# Patient Record
Sex: Female | Born: 1935 | Race: White | Hispanic: No | Marital: Married | Smoking: Never smoker
Health system: Southern US, Community
[De-identification: ages and names within clinical notes are randomized; demographics above are authoritative.]

## PROBLEM LIST (undated history)

## (undated) DIAGNOSIS — N289 Disorder of kidney and ureter, unspecified: Secondary | ICD-10-CM

## (undated) DIAGNOSIS — G629 Polyneuropathy, unspecified: Secondary | ICD-10-CM

## (undated) DIAGNOSIS — K219 Gastro-esophageal reflux disease without esophagitis: Secondary | ICD-10-CM

## (undated) DIAGNOSIS — D649 Anemia, unspecified: Secondary | ICD-10-CM

## (undated) DIAGNOSIS — R011 Cardiac murmur, unspecified: Secondary | ICD-10-CM

## (undated) DIAGNOSIS — I499 Cardiac arrhythmia, unspecified: Secondary | ICD-10-CM

## (undated) DIAGNOSIS — R600 Localized edema: Secondary | ICD-10-CM

## (undated) DIAGNOSIS — I10 Essential (primary) hypertension: Secondary | ICD-10-CM

## (undated) DIAGNOSIS — E119 Type 2 diabetes mellitus without complications: Secondary | ICD-10-CM

## (undated) DIAGNOSIS — H9190 Unspecified hearing loss, unspecified ear: Secondary | ICD-10-CM

## (undated) DIAGNOSIS — C4490 Unspecified malignant neoplasm of skin, unspecified: Secondary | ICD-10-CM

## (undated) DIAGNOSIS — M1990 Unspecified osteoarthritis, unspecified site: Secondary | ICD-10-CM

## (undated) DIAGNOSIS — N189 Chronic kidney disease, unspecified: Secondary | ICD-10-CM

## (undated) DIAGNOSIS — E7800 Pure hypercholesterolemia, unspecified: Secondary | ICD-10-CM

## (undated) HISTORY — PX: SKIN CANCER EXCISION: SHX779

## (undated) HISTORY — PX: CARDIAC CATHETERIZATION: SHX172

## (undated) HISTORY — PX: DILATION AND CURETTAGE, DIAGNOSTIC / THERAPEUTIC: SUR384

## (undated) HISTORY — PX: EYE SURGERY: SHX253

---

## 2004-07-25 ENCOUNTER — Ambulatory Visit: Payer: Self-pay | Admitting: Internal Medicine

## 2015-04-04 ENCOUNTER — Other Ambulatory Visit: Payer: Self-pay

## 2015-04-04 ENCOUNTER — Emergency Department: Payer: Medicare Other

## 2015-04-04 ENCOUNTER — Encounter: Payer: Self-pay | Admitting: Emergency Medicine

## 2015-04-04 ENCOUNTER — Emergency Department
Admission: EM | Admit: 2015-04-04 | Discharge: 2015-04-04 | Disposition: A | Discharge status: Home / Self Care | Payer: Medicare Other | Attending: Emergency Medicine | Admitting: Emergency Medicine

## 2015-04-04 DIAGNOSIS — I10 Essential (primary) hypertension: Secondary | ICD-10-CM | POA: Diagnosis not present

## 2015-04-04 DIAGNOSIS — E119 Type 2 diabetes mellitus without complications: Secondary | ICD-10-CM | POA: Insufficient documentation

## 2015-04-04 DIAGNOSIS — R079 Chest pain, unspecified: Secondary | ICD-10-CM | POA: Diagnosis not present

## 2015-04-04 DIAGNOSIS — R0602 Shortness of breath: Secondary | ICD-10-CM | POA: Insufficient documentation

## 2015-04-04 DIAGNOSIS — I499 Cardiac arrhythmia, unspecified: Secondary | ICD-10-CM | POA: Insufficient documentation

## 2015-04-04 HISTORY — DX: Type 2 diabetes mellitus without complications: E11.9

## 2015-04-04 HISTORY — DX: Essential (primary) hypertension: I10

## 2015-04-04 HISTORY — DX: Pure hypercholesterolemia, unspecified: E78.00

## 2015-04-04 HISTORY — DX: Gastro-esophageal reflux disease without esophagitis: K21.9

## 2015-04-04 LAB — COMPREHENSIVE METABOLIC PANEL
ALBUMIN: 4.2 g/dL (ref 3.5–5.0)
ALK PHOS: 50 U/L (ref 38–126)
ALT: 12 U/L — AB (ref 14–54)
AST: 28 U/L (ref 15–41)
Anion gap: 12 (ref 5–15)
BUN: 30 mg/dL — ABNORMAL HIGH (ref 6–20)
CALCIUM: 10.5 mg/dL — AB (ref 8.9–10.3)
CHLORIDE: 100 mmol/L — AB (ref 101–111)
CO2: 25 mmol/L (ref 22–32)
CREATININE: 1.4 mg/dL — AB (ref 0.44–1.00)
GFR calc Af Amer: 41 mL/min — ABNORMAL LOW (ref >60)
GFR calc non Af Amer: 35 mL/min — ABNORMAL LOW (ref >60)
GLUCOSE: 154 mg/dL — AB (ref 65–99)
Potassium: 3.5 mmol/L (ref 3.5–5.1)
SODIUM: 137 mmol/L (ref 135–145)
TOTAL PROTEIN: 7.7 g/dL (ref 6.5–8.1)
Total Bilirubin: 0.5 mg/dL (ref 0.3–1.2)

## 2015-04-04 LAB — BRAIN NATRIURETIC PEPTIDE: B Natriuretic Peptide: 22 pg/mL (ref 0.0–100.0)

## 2015-04-04 LAB — CBC
HEMATOCRIT: 35.5 % (ref 35.0–47.0)
HEMOGLOBIN: 11.8 g/dL — AB (ref 12.0–16.0)
MCH: 28.2 pg (ref 26.0–34.0)
MCHC: 33.1 g/dL (ref 32.0–36.0)
MCV: 85.1 fL (ref 80.0–100.0)
Platelets: 226 10*3/uL (ref 150–440)
RBC: 4.17 MIL/uL (ref 3.80–5.20)
RDW: 13.3 % (ref 11.5–14.5)
WBC: 6.8 10*3/uL (ref 3.6–11.0)

## 2015-04-04 LAB — TROPONIN I: Troponin I: <0.03 ng/mL

## 2015-04-04 MED ORDER — SODIUM CHLORIDE 0.9 % IV SOLN
Freq: Once | INTRAVENOUS | Status: AC
  Administered 2015-04-04: 13:00:00 via INTRAVENOUS

## 2015-04-04 NOTE — Discharge Instructions (Signed)
Chest Pain (Nonspecific) °It is often hard to give a specific diagnosis for the cause of chest pain. There is always a chance that your pain could be related to something serious, such as a heart attack or a blood clot in the lungs. You need to follow up with your health care provider for further evaluation. °CAUSES  °· Heartburn. °· Pneumonia or bronchitis. °· Anxiety or stress. °· Inflammation around your heart (pericarditis) or lung (pleuritis or pleurisy). °· A blood clot in the lung. °· A collapsed lung (pneumothorax). It can develop suddenly on its own (spontaneous pneumothorax) or from trauma to the chest. °· Shingles infection (herpes zoster virus). °The chest wall is composed of bones, muscles, and cartilage. Any of these can be the source of the pain. °· The bones can be bruised by injury. °· The muscles or cartilage can be strained by coughing or overwork. °· The cartilage can be affected by inflammation and become sore (costochondritis). °DIAGNOSIS  °Lab tests or other studies may be needed to find the cause of your pain. Your health care provider may have you take a test called an ambulatory electrocardiogram (ECG). An ECG records your heartbeat patterns over a 24-hour period. You may also have other tests, such as: °· Transthoracic echocardiogram (TTE). During echocardiography, sound waves are used to evaluate how blood flows through your heart. °· Transesophageal echocardiogram (TEE). °· Cardiac monitoring. This allows your health care provider to monitor your heart rate and rhythm in real time. °· Holter monitor. This is a portable device that records your heartbeat and can help diagnose heart arrhythmias. It allows your health care provider to track your heart activity for several days, if needed. °· Stress tests by exercise or by giving medicine that makes the heart beat faster. °TREATMENT  °· Treatment depends on what may be causing your chest pain. Treatment may include: °· Acid blockers for  heartburn. °· Anti-inflammatory medicine. °· Pain medicine for inflammatory conditions. °· Antibiotics if an infection is present. °· You may be advised to change lifestyle habits. This includes stopping smoking and avoiding alcohol, caffeine, and chocolate. °· You may be advised to keep your head raised (elevated) when sleeping. This reduces the chance of acid going backward from your stomach into your esophagus. °Most of the time, nonspecific chest pain will improve within 2-3 days with rest and mild pain medicine.  °HOME CARE INSTRUCTIONS  °· If antibiotics were prescribed, take them as directed. Finish them even if you start to feel better. °· For the next few days, avoid physical activities that bring on chest pain. Continue physical activities as directed. °· Do not use any tobacco products, including cigarettes, chewing tobacco, or electronic cigarettes. °· Avoid drinking alcohol. °· Only take medicine as directed by your health care provider. °· Follow your health care provider's suggestions for further testing if your chest pain does not go away. °· Keep any follow-up appointments you made. If you do not go to an appointment, you could develop lasting (chronic) problems with pain. If there is any problem keeping an appointment, call to reschedule. °SEEK MEDICAL CARE IF:  °· Your chest pain does not go away, even after treatment. °· You have a rash with blisters on your chest. °· You have a fever. °SEEK IMMEDIATE MEDICAL CARE IF:  °· You have increased chest pain or pain that spreads to your arm, neck, jaw, back, or abdomen. °· You have shortness of breath. °· You have an increasing cough, or you cough   up blood.  You have severe back or abdominal pain.  You feel nauseous or vomit.  You have severe weakness.  You faint.  You have chills. This is an emergency. Do not wait to see if the pain will go away. Get medical help at once. Call your local emergency services (911 in [place] Do not drive  yourself to the hospital. MAKE SURE YOU:   Understand these instructions.  Will watch your condition.  Will get help right away if you are not doing well or get worse. Document Released: 05/03/2005 Document Revised: 07/29/2013 Document Reviewed: 02/27/2008 [facility] Patient Information 2015 [place], [place]. This information is not intended to replace advice given to you by your health care provider. Make sure you discuss any questions you have with your health care provider.  Hypercalcemia Hypercalcemia means the calcium in your blood is too high. Calcium in our blood is important for the control of many things, such as:  Blood clotting.  Conducting of nerve impulses.  Muscle contraction.  Maintaining teeth and bone health.  Other body functions. In the bloodstream, calcium maintains a constant balance with another mineral, phosphate. Calcium is absorbed into the body through the small intestine. This is helped by vitamin D. Calcium levels are maintained mostly by vitamin D and a hormone (parathyroid hormone). But the kidneys also help. Hypercalcemia can happen when the concentration of calcium is too high for the kidneys to maintain balance. The body maintains a balance between the calcium we eat and the calcium already in our body. If calcium intake is increased or we cannot use calcium properly, there may be problems. Some common sources of calcium are:   Dairy products.  Nuts.  Eggs.  Whole grains.  Legumes.  Green leafy vegetables. CAUSES There are many causes of this condition, but some common ones are:  Hyperparathyroidism. This is an overactivity of the parathyroid gland.  Cancers of the breast, kidney, lung, head, and neck are common causes of calcium increases.  Medications that cause you to urinate more often (diuretics), nausea, vomiting, and diarrhea also increase the calcium in the blood.  Overuse of calcium-containing antacids. SYMPTOMS  Many patients with  mild hypercalcemia have no symptoms. For those with symptoms, common problems include:  Loss of appetite.  Constipation.  Increased thirst.  Heart rhythm changes.  Abnormal thinking.  Nausea.  Abdominal pain.  Kidney stones.  Mood swings.  Coma and death when severe.  Vomiting.  Increased urination.  High blood pressure.  Confusion. DIAGNOSIS   Your caregiver will do a medical history and perform a physical exam on you.  Calcium and parathyroid hormone (PTH) may be measured with a blood test. TREATMENT   The treatment depends on the calcium level and what is causing the higher level. Hypercalcemia can be life threatening. Fast lowering of the calcium level may be necessary.  With normal kidney function, fluids can be given by vein to clear the excess calcium. Hemodialysis works well to reduce dangerous calcium levels if there is poor kidney function. This is a procedure in which a machine is used to filter out unwanted substances. The blood is then returned to the body.  Drugs, such as diuretics, can be given after adequate fluid intake is established. These medications help the kidneys get rid of extra calcium. Drugs that lessen (inhibit) bone loss are helpful in gaining long-term control. Phosphate pills help lower high calcium levels caused by a low supply of phosphate. Anti-inflammatory agents such as steroids are helpful with  some cancers and toxic levels of vitamin D.  Treatment of the underlying cause of the hypercalcemia will also correct the imbalance. Hyperparathyroidism is usually treated by surgical removal of one or more of the parathyroid glands and any tissue, other than the glands themselves, that is producing too much hormone.  The hypercalcemia caused by cancer is difficult to treat without controlling the cancer. Symptoms can be improved with fluids and drug therapy as outlined above. PROGNOSIS   Surgery to remove the parathyroid glands is usually  successful. This also depends on the amount of damage to the kidneys and whether or not it can be treated.  Mild hypercalcemia can be controlled with good fluid intake and the use of effective medications.  Hypercalcemia often develops as a late complication of cancer. The expected outlook is poor without effective anticancer therapy. PREVENTION   If you are at risk for developing hypercalcemia, be familiar with early symptoms. Report these to your caregiver.  Good fluid intake (up to four quarts of liquid a day if possible) is helpful.  Try to control nausea and vomiting, and treat fevers to avoid dehydration.  Lowering the amount of calcium in your diet is not necessary. High blood calcium reduces absorption of calcium in the intestine.  Stay as active as possible. SEEK IMMEDIATE MEDICAL CARE IF:   You develop chest pain, sweating, or shortness of breath.  You get confused, feel faint or pass out.  You develop severe nausea and vomiting. MAKE SURE YOU:   Understand these instructions.  Will watch your condition.  Will get help right away if you are not doing well or get worse. Document Released: 10/07/2004 Document Revised: 12/08/2013 Document Reviewed: 07/19/2010 [facility] Patient Information 2015 [place], [place]. This information is not intended to replace advice given to you by your health care provider. Make sure you discuss any questions you have with your health care provider.

## 2015-04-04 NOTE — ED Notes (Signed)
States she has had some intermittent chest pain for about 2 weeks  But this am she developed pain to mid chest and under left arm

## 2015-04-04 NOTE — ED Notes (Signed)

## 2015-04-04 NOTE — ED Provider Notes (Signed)
[facility] Emergency Department Provider Note     Time seen: ----------------------------------------- 12:14 PM on 04/04/2015 -----------------------------------------    I have reviewed the triage vital signs and the nursing notes.   HISTORY  Chief Complaint Chest Pain    HPI Paula Lara is a 81 y.o. female who presents ER for intermittent chest pain for 2 weeks. Patient mainly complains of some pain in her axilla today. Did have pain in her mid chest and under her left arm earlier, pain now is only located on her left arm. She has had this in the past she states in the 42s but was not associated with a heart attack. She denies any fevers or chills, he is short of breath. She denies any nausea vomiting or diarrhea. Patient reports outpatient testing which revealed hypercalcemia recently which she felt like was from medication that she was taking   Past Medical History  Diagnosis Date  . Hypertension   . Diabetes mellitus without complication   . GERD (gastroesophageal reflux disease)   . Hypercholesterolemia     There are no active problems to display for this patient.   History reviewed. No pertinent past surgical history.  Allergies Metformin and related and Metoprolol  Social History Social History  Substance Use Topics  . Smoking status: Never Smoker   . Smokeless tobacco: None  . Alcohol Use: No    Review of Systems Constitutional: Negative for fever. Eyes: Negative for visual changes. ENT: Negative for sore throat. Cardiovascular: Positive for chest pain Respiratory: Positive for shortness of breath Gastrointestinal: Negative for abdominal pain, vomiting and diarrhea. Genitourinary: Negative for dysuria. Musculoskeletal: Negative for back pain. Skin: Negative for rash. Neurological: Negative for headaches, focal weakness or numbness.  10-point ROS otherwise  negative.  ____________________________________________   PHYSICAL EXAM:  VITAL SIGNS: ED Triage Vitals  Enc Vitals Group     BP 04/04/15 1205 190/107 mmHg     Pulse Rate 04/04/15 1205 109     Resp 04/04/15 1205 20     Temp 04/04/15 1205 98.2 F (36.8 C)     Temp Source 04/04/15 1205 Oral     SpO2 --      Weight 04/04/15 1205 157 lb (71.215 kg)     Height 04/04/15 1205 5\' 2"  (1.575 m)     Head Cir --      Peak Flow --      Pain Score 04/04/15 1205 2     Pain Loc --      Pain Edu? --      Excl. in [place]? --     Constitutional: Alert and oriented. Well appearing and in no distress. Eyes: Conjunctivae are normal. PERRL. Normal extraocular movements. ENT   Head: Normocephalic and atraumatic.   Nose: No congestion/rhinnorhea.   Mouth/Throat: Mucous membranes are moist.   Neck: No stridor. Cardiovascular: Irregularly irregular rhythm. Normal and symmetric distal pulses are present in all extremities. No murmurs, rubs, or gallops. Respiratory: Normal respiratory effort without tachypnea nor retractions. Breath sounds are clear and equal bilaterally. No wheezes/rales/rhonchi. Gastrointestinal: Soft and nontender. No distention. No abdominal bruits.  Musculoskeletal: Nontender with normal range of motion in all extremities. No joint effusions.  No lower extremity tenderness nor edema. Neurologic:  Normal speech and language. No gross focal neurologic deficits are appreciated. Speech is normal. No gait instability. Skin:  Skin is warm, dry and intact. No rash noted. Psychiatric: Mood and affect are normal. Speech and behavior are normal. Patient exhibits appropriate insight  and judgment. ____________________________________________  EKG: Interpreted by me. Sinus tachycardia with a rate of 109 bpm, frequent PVCs, possible anterior infarct age indeterminate, normal axis.  ____________________________________________  ED COURSE:  Pertinent labs & imaging results that were  available during my care of the patient were reviewed by me and considered in my medical decision making (see chart for details). Unclear etiology for symptoms. Patient will need cardiac workup and recheck her calcium levels. Concern for hypercalcemia due to occult malignancy. ____________________________________________    LABS (pertinent positives/negatives)  Labs Reviewed  CBC - Abnormal; Notable for the following:    Hemoglobin 11.8 (*)    All other components within normal limits  COMPREHENSIVE METABOLIC PANEL - Abnormal; Notable for the following:    Chloride 100 (*)    Glucose, Bld 154 (*)    BUN 30 (*)    Creatinine, Ser 1.40 (*)    Calcium 10.5 (*)    ALT 12 (*)    GFR calc non Af Amer 35 (*)    GFR calc Af Amer 41 (*)    All other components within normal limits  TROPONIN I  BRAIN NATRIURETIC PEPTIDE  TROPONIN I    RADIOLOGY Images were viewed by me  Chest x-ray IMPRESSION: Borderline cardiomegaly, without acute disease. ____________________________________________  FINAL ASSESSMENT AND PLAN  Chest pain and hypercalcemia  Plan: Patient with labs and imaging as dictated above. Troponin was negative 2 here. She is stable for outpatient follow-up with her doctor in the next 2 days for reevaluation.   Earleen Newport, MD\  Earleen Newport, MD 04/04/15 [phone]

## 2015-04-04 NOTE — ED Notes (Signed)
Troponin to be redrawn at 245.

## 2015-12-04 ENCOUNTER — Emergency Department
Admission: EM | Admit: 2015-12-04 | Discharge: 2015-12-04 | Disposition: A | Discharge status: Home / Self Care | Payer: Medicare Other | Attending: Emergency Medicine | Admitting: Emergency Medicine

## 2015-12-04 ENCOUNTER — Encounter: Payer: Self-pay | Admitting: Emergency Medicine

## 2015-12-04 DIAGNOSIS — Y929 Unspecified place or not applicable: Secondary | ICD-10-CM | POA: Insufficient documentation

## 2015-12-04 DIAGNOSIS — S00501A Unspecified superficial injury of lip, initial encounter: Secondary | ICD-10-CM | POA: Diagnosis present

## 2015-12-04 DIAGNOSIS — I10 Essential (primary) hypertension: Secondary | ICD-10-CM | POA: Insufficient documentation

## 2015-12-04 DIAGNOSIS — E119 Type 2 diabetes mellitus without complications: Secondary | ICD-10-CM | POA: Insufficient documentation

## 2015-12-04 DIAGNOSIS — K130 Diseases of lips: Secondary | ICD-10-CM

## 2015-12-04 DIAGNOSIS — Y9384 Activity, sleeping: Secondary | ICD-10-CM | POA: Diagnosis not present

## 2015-12-04 DIAGNOSIS — S01511A Laceration without foreign body of lip, initial encounter: Secondary | ICD-10-CM | POA: Diagnosis not present

## 2015-12-04 DIAGNOSIS — W503XXA Accidental bite by another person, initial encounter: Secondary | ICD-10-CM | POA: Insufficient documentation

## 2015-12-04 DIAGNOSIS — Z7982 Long term (current) use of aspirin: Secondary | ICD-10-CM | POA: Insufficient documentation

## 2015-12-04 DIAGNOSIS — Y999 Unspecified external cause status: Secondary | ICD-10-CM | POA: Diagnosis not present

## 2015-12-04 MED ORDER — MAGIC MOUTHWASH: 5.0000 mL | Freq: Four times a day (QID) | ORAL | Status: DC | PRN

## 2015-12-04 MED ORDER — CEPHALEXIN 500 MG PO CAPS: 500.0000 mg | ORAL_CAPSULE | Freq: Two times a day (BID) | ORAL | Status: DC

## 2015-12-04 NOTE — ED Provider Notes (Signed)
[facility] Emergency Department Provider Note  ____________________________________________  Time seen: Approximately 4:41 PM  I have reviewed the triage vital signs and the nursing notes.   HISTORY  Chief Complaint Mouth Injury    HPI Paula Lara is a 82 y.o. female , NAD, presents to the emergency department with lower lip swelling 2 days. States she bit her lip and her sleep Thursday evening. She is noted increasing redness, swelling and tenderness of the inside the lip and right cheek. Has not had any fevers, chills, body aches. Has felt a little lightheaded in which she saw her primary care provider for evaluation on Wednesday who is completing lab work. Patient notes that lightheadedness is improving at this time. Patient notes that she had nausea, vomiting, diarrhea and body aches earlier in the week in which has resolved. Patient denies chest pain, shortness of breath, back pain, neck pain. Has not had any visual changes, weakness, tingling.   Past Medical History  Diagnosis Date  . Hypertension   . Diabetes mellitus without complication (Gas)   . GERD (gastroesophageal reflux disease)   . Hypercholesterolemia     There are no active problems to display for this patient.   History reviewed. No pertinent past surgical history.  Current Outpatient Rx  Name  Route  Sig  Dispense  Refill  . acetaminophen (TYLENOL) 650 MG CR tablet   Oral   Take 1,300 mg by mouth 2 (two) times daily.         Marland Kitchen amoxicillin (AMOXIL) 500 MG capsule   Oral   Take 1,000 mg by mouth 2 (two) times daily. X 10 days.      0   . aspirin 325 MG EC tablet   Oral   Take 325 mg by mouth daily.         . carvedilol (COREG) 6.25 MG tablet   Oral   Take 6.25 mg by mouth 2 (two) times daily.      0   . cephALEXin (KEFLEX) 500 MG capsule   Oral   Take 1 capsule (500 mg total) by mouth 2 (two) times daily.   14 capsule   0   . cholecalciferol (VITAMIN D)  1000 UNITS tablet   Oral   Take 1,000 Units by mouth 2 (two) times daily.         . diphenhydrAMINE (BENADRYL) 25 MG tablet   Oral   Take 25 mg by mouth at bedtime as needed for itching, allergies or sleep.         Mariane Baumgarten Sodium 100 MG capsule   Oral   Take 100 mg by mouth 2 (two) times daily.         . enalapril (VASOTEC) 10 MG tablet   Oral   Take 10-20 mg by mouth 2 (two) times daily. Take 1 tablet orally every morning and Take 2 tablets (20mg ) orally every morning.      1   . enalapril-hydrochlorothiazide (VASERETIC) 10-25 MG per tablet   Oral   Take 1 tablet by mouth every morning.      3   . glipiZIDE (GLUCOTROL) 10 MG tablet   Oral   Take 10 mg by mouth 2 (two) times daily before a meal. T      3   . Inositol Niacinate (NIACIN FLUSH FREE) 500 MG CAPS   Oral   Take 1,000 mg by mouth at bedtime.         Marland Kitchen loratadine (CLARITIN)  10 MG tablet   Oral   Take 10 mg by mouth daily.         . magic mouthwash SOLN   Oral   Take 5 mLs by mouth 4 (four) times daily as needed for mouth pain. Please mix 53mL of each ingredient totaling 140mL   180 mL   0   . omeprazole (PRILOSEC) 40 MG capsule   Oral   Take 40 mg by mouth daily.      0   . Polyethylene Glycol 3350 (MIRALAX PO)   Oral   Take 17 g by mouth daily.         Marland Kitchen triamcinolone (NASACORT ALLERGY 24HR CHILDREN) 55 MCG/ACT AERO nasal inhaler   Nasal   Place 2 sprays into the nose daily as needed (for allergies.).          Marland Kitchen triamcinolone cream (KENALOG) 0.1 %   Topical   Apply 1 application topically 2 (two) times daily as needed (for rash.).           Allergies Metformin and related and Metoprolol  History reviewed. No pertinent family history.  Social History Social History  Substance Use Topics  . Smoking status: Never Smoker   . Smokeless tobacco: None  . Alcohol Use: No     Review of Systems  Constitutional: No fever/chills or fatigue Eyes: No visual changes.  ENT:  Positive pain about lower lip with swelling. No sore throat, mouth pain or mouth sores. Cardiovascular: No chest pain. Respiratory: No shortness of breath. No wheezing.  Gastrointestinal: No abdominal pain.  No nausea, vomiting.  Musculoskeletal: Negative for general myalgias.  Skin: Positive laceration lower lip causing swelling and redness. Negative for rash. Neurological: Negative for headaches, focal weakness or numbness. No tingling 10-point ROS otherwise negative.  ____________________________________________   PHYSICAL EXAM:  VITAL SIGNS: ED Triage Vitals  Enc Vitals Group     BP 12/04/15 1551 119/92 mmHg     Pulse Rate 12/04/15 1551 61     Resp 12/04/15 1551 18     Temp 12/04/15 1551 98.1 F (36.7 C)     Temp Source 12/04/15 1551 Oral     SpO2 12/04/15 1551 98 %     Weight 12/04/15 1551 154 lb (69.854 kg)     Height 12/04/15 1551 5\' 4"  (1.626 m)     Head Cir --      Peak Flow --      Pain Score 12/04/15 1555 0     Pain Loc --      Pain Edu? --      Excl. in [place]? --      Constitutional: Alert and oriented. Well appearing and in no acute distress. Eyes: Conjunctivae are normal. PERRL. EOMI without pain.  Head: Atraumatic. ENT:      Nose: No congestion/rhinnorhea.      Mouth/Throat: Superficial laceration noted to the anterior lower lip on the right side. Area with mild erythema surrounding and ulcerous type lesion. Mild tenderness to palpation. Mucous membranes are moist.  Neck: Supple with full range of motion. Hematological/Lymphatic/Immunilogical: No cervical lymphadenopathy. Cardiovascular:  Good peripheral circulation with 2+ pulses noted in the right upper extremity. Respiratory: Normal respiratory effort without tachypnea or retractions.  Neurologic:  Normal speech and language. No gross focal neurologic deficits are appreciated.  Skin:  Skin is warm, dry and intact. No rash noted. Psychiatric: Mood and affect are normal. Speech and behavior are normal.  Patient exhibits appropriate insight and judgement.  ____________________________________________   LABS  None ____________________________________________  EKG  None ____________________________________________  RADIOLOGY  None ____________________________________________    PROCEDURES  Procedure(s) performed: None    Medications - No data to display   ____________________________________________   INITIAL IMPRESSION / ASSESSMENT AND PLAN / ED COURSE  Patient's diagnosis is consistent with laceration of lower lip due to biting her lower lip causing cellulitis and ulcers. Patient will be discharged home with prescriptions for Magic mouthwash and Keflex to take as directed. Patient may take Tylenol as needed for pain. Patient is to follow up with her primary care provider or [facility] if symptoms persist past this treatment course. Patient is given ED precautions to return to the ED for any worsening or new symptoms.    ____________________________________________  FINAL CLINICAL IMPRESSION(S) / ED DIAGNOSES  Final diagnoses:  Laceration of lower lip, initial encounter  Cellulitis of vermilion border of lower lip      NEW MEDICATIONS STARTED DURING THIS VISIT:  Discharge Medication List as of 12/04/2015  5:00 PM    START taking these medications   Details  cephALEXin (KEFLEX) 500 MG capsule Take 1 capsule (500 mg total) by mouth 2 (two) times daily., Starting 12/04/2015, Until Discontinued, Print    magic mouthwash SOLN Take 5 mLs by mouth 4 (four) times daily as needed for mouth pain. Please mix 76mL of each ingredient totaling 171mL, Starting 12/04/2015, Until Discontinued, Print             Braxton Feathers, PA-C 12/04/15 1803  Lisa Roca, MD 12/05/15 [phone]

## 2015-12-04 NOTE — Discharge Instructions (Signed)
Mouth Laceration A mouth laceration is a deep cut inside your mouth. The cut may go into your lip or go all of the way through your mouth and cheek. The cut may involve your tongue, the insides of your check, or the upper surface of your mouth (palate). Mouth lacerations may bleed a lot and may need to be treated with stitches (sutures). HOME CARE  Take medicines only as told by your doctor.  If you were prescribed an antibiotic medicine, finish all of it even if you start to feel better.  Eat as told by your doctor. You may only be able to eat drink liquids or eat soft foods for a few days.  Rinse your mouth with a warm, salt-water rinse 4-6 times per day or as told by your doctor. You can make a salt-water rinse by mixing one tsp of salt into two cups of warm water.  Do not poke the sutures with your tongue. Doing that can loosen them.  Check your wound every day for signs of infection. It is normal to have a white or gray patch over your wound while it heals. Watch for:  Redness.  Puffiness (swelling).  Blood or pus.  Keep your mouth and teeth clean (oral hygiene) like you normally do, if possible. Gently brush your teeth with a soft, nylon-bristled toothbrush 2 times per day.  Keep all follow-up visits as told by your doctor. This is important. GET HELP IF:  You got a tetanus shot and you have swelling, really bad pain, redness, or bleeding at the injection site.  You have a fever.  Medicine does not help your pain.  You have redness, swelling, or pain at your wound that is getting worse.  You have fresh bleeding or pus coming from your wound.  The edges of your wound break open.  Your neck or throat is puffy or tender. GET HELP RIGHT AWAY IF:  You have swelling in your face or the area under your jaw.  You have trouble breathing or swallowing.   This information is not intended to replace advice given to you by your health care provider. Make sure you discuss any  questions you have with your health care provider.   Document Released: 01/10/2008 Document Revised: 12/08/2014 Document Reviewed: 07/15/2014 Elsevier Interactive Patient Education 2016 [place] If you have a wound, it may take some time to heal. Eventually, a scar will form. The scar will also fade with time. It is important to take care of your wound while it is healing. This helps to protect your wound from infection.  HOW SHOULD I TAKE CARE OF MY WOUND AT HOME?  Some wounds are allowed to close on their own or are repaired at a later date. There are many different ways to close and cover a wound, including stitches (sutures), skin glue, and adhesive strips. Follow your health care provider's instructions about:  Wound care.  Bandage (dressing) changes and removal.  Wound closure removal.  Take medicines only as directed by your health care provider.  Keep all follow-up visits as directed by your health care provider. This is important.  Do not take baths, swim, or use a hot tub until your health care provider approves. You may shower as directed by your health care provider.  Keep your wound clean and dry. WHAT AFFECTS SCAR FORMATION? Scars affect each person differently. How your body scars depends on:  The location and size of your wound.  Traits that  you inherited from your parents (genetic predisposition).  How you take care of your wound. Irritation and inflammation increase the amount of scar formation.  Sun exposure. This can darken a scar. WHEN SHOULD I CALL OR SEE [place] PROVIDER? Call or see your health care provider if:  You have redness, swelling, or pain at your wound site.  You have fluid, blood, or pus coming from your wound.  You have muscle aches, chills, or a general ill feeling.  You notice a bad smell coming from the wound.  Your wound separates after the sutures, staples, or skin adhesive strips have been removed.  You  have persistent nausea or vomiting.  You have a fever.  You are dizzy. WHEN SHOULD I CALL 911 OR GO TO THE EMERGENCY ROOM? Call 911 or go to the emergency room if:  You faint.  You have difficulty breathing.   This information is not intended to replace advice given to you by your health care provider. Make sure you discuss any questions you have with your health care provider.   Document Released: 04/29/2004 Document Revised: 08/14/2014 Document Reviewed: 05/05/2014 Elsevier Interactive Patient Education [employer].

## 2015-12-04 NOTE — ED Notes (Signed)
Bit lip on Thursday, now red and swollen on the inside of lip and cheek

## 2015-12-05 ENCOUNTER — Telehealth: Payer: Self-pay | Admitting: Emergency Medicine

## 2016-06-23 ENCOUNTER — Other Ambulatory Visit: Payer: Self-pay | Admitting: Nephrology

## 2016-06-23 DIAGNOSIS — N183 Chronic kidney disease, stage 3 unspecified: Secondary | ICD-10-CM

## 2016-06-28 ENCOUNTER — Ambulatory Visit
Admission: RE | Admit: 2016-06-28 | Discharge: 2016-06-28 | Disposition: A | Discharge status: Home / Self Care | Payer: Medicare Other | Source: Ambulatory Visit | Attending: Nephrology | Admitting: Nephrology

## 2016-06-28 DIAGNOSIS — N183 Chronic kidney disease, stage 3 unspecified: Secondary | ICD-10-CM

## 2016-06-28 DIAGNOSIS — N281 Cyst of kidney, acquired: Secondary | ICD-10-CM | POA: Diagnosis not present

## 2016-09-10 ENCOUNTER — Encounter: Payer: Self-pay | Admitting: Emergency Medicine

## 2016-09-10 ENCOUNTER — Emergency Department: Payer: Medicare Other

## 2016-09-10 ENCOUNTER — Observation Stay
Admission: EM | Admit: 2016-09-10 | Discharge: 2016-09-11 | Disposition: A | Discharge status: Home / Self Care | Payer: Medicare Other | Attending: Internal Medicine | Admitting: Internal Medicine

## 2016-09-10 DIAGNOSIS — N183 Chronic kidney disease, stage 3 unspecified: Secondary | ICD-10-CM

## 2016-09-10 DIAGNOSIS — Z7982 Long term (current) use of aspirin: Secondary | ICD-10-CM | POA: Diagnosis not present

## 2016-09-10 DIAGNOSIS — I10 Essential (primary) hypertension: Secondary | ICD-10-CM

## 2016-09-10 DIAGNOSIS — Z7984 Long term (current) use of oral hypoglycemic drugs: Secondary | ICD-10-CM | POA: Insufficient documentation

## 2016-09-10 DIAGNOSIS — R55 Syncope and collapse: Secondary | ICD-10-CM | POA: Diagnosis not present

## 2016-09-10 DIAGNOSIS — Z79899 Other long term (current) drug therapy: Secondary | ICD-10-CM | POA: Diagnosis not present

## 2016-09-10 DIAGNOSIS — I129 Hypertensive chronic kidney disease with stage 1 through stage 4 chronic kidney disease, or unspecified chronic kidney disease: Secondary | ICD-10-CM | POA: Diagnosis not present

## 2016-09-10 DIAGNOSIS — K219 Gastro-esophageal reflux disease without esophagitis: Secondary | ICD-10-CM | POA: Insufficient documentation

## 2016-09-10 DIAGNOSIS — R002 Palpitations: Secondary | ICD-10-CM

## 2016-09-10 DIAGNOSIS — E860 Dehydration: Secondary | ICD-10-CM | POA: Insufficient documentation

## 2016-09-10 DIAGNOSIS — E1122 Type 2 diabetes mellitus with diabetic chronic kidney disease: Secondary | ICD-10-CM | POA: Insufficient documentation

## 2016-09-10 HISTORY — DX: Disorder of kidney and ureter, unspecified: N28.9

## 2016-09-10 LAB — COMPREHENSIVE METABOLIC PANEL
ALT: 9 U/L — ABNORMAL LOW (ref 14–54)
AST: 16 U/L (ref 15–41)
Albumin: 4.1 g/dL (ref 3.5–5.0)
Alkaline Phosphatase: 64 U/L (ref 38–126)
Anion gap: 10 (ref 5–15)
BILIRUBIN TOTAL: 0.5 mg/dL (ref 0.3–1.2)
BUN: 31 mg/dL — ABNORMAL HIGH (ref 6–20)
CHLORIDE: 101 mmol/L (ref 101–111)
CO2: 25 mmol/L (ref 22–32)
CREATININE: 1.38 mg/dL — AB (ref 0.44–1.00)
Calcium: 9.2 mg/dL (ref 8.9–10.3)
GFR, EST AFRICAN AMERICAN: 41 mL/min — AB (ref >60)
GFR, EST NON AFRICAN AMERICAN: 35 mL/min — AB (ref >60)
Glucose, Bld: 99 mg/dL (ref 65–99)
POTASSIUM: 3.6 mmol/L (ref 3.5–5.1)
Sodium: 136 mmol/L (ref 135–145)
TOTAL PROTEIN: 7.8 g/dL (ref 6.5–8.1)

## 2016-09-10 LAB — TSH: TSH: 2.168 u[IU]/mL (ref 0.350–4.500)

## 2016-09-10 LAB — GLUCOSE, CAPILLARY: Glucose-Capillary: 124 mg/dL — ABNORMAL HIGH (ref 65–99)

## 2016-09-10 LAB — CBC
HEMATOCRIT: 32.6 % — AB (ref 35.0–47.0)
Hemoglobin: 11.3 g/dL — ABNORMAL LOW (ref 12.0–16.0)
MCH: 29.4 pg (ref 26.0–34.0)
MCHC: 34.7 g/dL (ref 32.0–36.0)
MCV: 84.7 fL (ref 80.0–100.0)
PLATELETS: 318 10*3/uL (ref 150–440)
RBC: 3.85 MIL/uL (ref 3.80–5.20)
RDW: 12.8 % (ref 11.5–14.5)
WBC: 6.4 10*3/uL (ref 3.6–11.0)

## 2016-09-10 LAB — MAGNESIUM: Magnesium: 1.7 mg/dL (ref 1.7–2.4)

## 2016-09-10 LAB — TROPONIN I: Troponin I: <0.03 ng/mL (ref <0.03)

## 2016-09-10 MED ORDER — CARVEDILOL 6.25 MG PO TABS
12.5000 mg | ORAL_TABLET | Freq: Two times a day (BID) | ORAL | Status: DC
  Administered 2016-09-10 – 2016-09-11 (×2): 12.5 mg via ORAL
  Filled 2016-09-10: qty 2

## 2016-09-10 MED ORDER — ACETAMINOPHEN 325 MG PO TABS: 650.0000 mg | ORAL_TABLET | Freq: Four times a day (QID) | ORAL | Status: DC | PRN

## 2016-09-10 MED ORDER — HEPARIN SODIUM (PORCINE) 5000 UNIT/ML IJ SOLN
5000.0000 [IU] | Freq: Three times a day (TID) | INTRAMUSCULAR | Status: DC
  Administered 2016-09-10 – 2016-09-11 (×2): 5000 [IU] via SUBCUTANEOUS
  Filled 2016-09-10 (×2): qty 1

## 2016-09-10 MED ORDER — POTASSIUM CHLORIDE IN NACL 20-0.9 MEQ/L-% IV SOLN
INTRAVENOUS | Status: DC
  Administered 2016-09-10 – 2016-09-11 (×2): via INTRAVENOUS
  Filled 2016-09-10 (×3): qty 1000

## 2016-09-10 MED ORDER — PANTOPRAZOLE SODIUM 40 MG PO TBEC
40.0000 mg | DELAYED_RELEASE_TABLET | Freq: Every day | ORAL | Status: DC
  Administered 2016-09-10 – 2016-09-11 (×2): 40 mg via ORAL
  Filled 2016-09-10 (×2): qty 1

## 2016-09-10 MED ORDER — INSULIN ASPART 100 UNIT/ML ~~LOC~~ SOLN
0.0000 [IU] | Freq: Three times a day (TID) | SUBCUTANEOUS | Status: DC
Start: 2016-09-10 — End: 2016-09-11

## 2016-09-10 MED ORDER — HYDROCODONE-ACETAMINOPHEN 5-325 MG PO TABS: 1.0000 | ORAL_TABLET | Freq: Four times a day (QID) | ORAL | Status: DC | PRN

## 2016-09-10 MED ORDER — DIPHENHYDRAMINE HCL 25 MG PO CAPS
50.0000 mg | ORAL_CAPSULE | Freq: Every day | ORAL | Status: DC
  Administered 2016-09-10: 50 mg via ORAL
  Filled 2016-09-10: qty 2

## 2016-09-10 MED ORDER — ENALAPRIL-HYDROCHLOROTHIAZIDE 10-25 MG PO TABS: 1.0000 | ORAL_TABLET | ORAL | Status: DC

## 2016-09-10 MED ORDER — CARVEDILOL 6.25 MG PO TABS
ORAL_TABLET | ORAL | Status: AC
  Filled 2016-09-10: qty 2

## 2016-09-10 MED ORDER — ONDANSETRON HCL 4 MG PO TABS: 4.0000 mg | ORAL_TABLET | Freq: Four times a day (QID) | ORAL | Status: DC | PRN

## 2016-09-10 MED ORDER — ENALAPRIL MALEATE 10 MG PO TABS
10.0000 mg | ORAL_TABLET | Freq: Every day | ORAL | Status: DC
  Administered 2016-09-11: 10 mg via ORAL
  Filled 2016-09-10: qty 1

## 2016-09-10 MED ORDER — MAGNESIUM SULFATE 2 GM/50ML IV SOLN
INTRAVENOUS | Status: AC
  Filled 2016-09-10: qty 50

## 2016-09-10 MED ORDER — NIACIN ER 500 MG PO CPCR
1000.0000 mg | ORAL_CAPSULE | Freq: Every day | ORAL | Status: DC
  Filled 2016-09-10: qty 4

## 2016-09-10 MED ORDER — DOCUSATE SODIUM 100 MG PO CAPS
100.0000 mg | ORAL_CAPSULE | Freq: Two times a day (BID) | ORAL | Status: DC
  Administered 2016-09-10: 100 mg via ORAL
  Filled 2016-09-10 (×2): qty 1

## 2016-09-10 MED ORDER — GLIPIZIDE 10 MG PO TABS
10.0000 mg | ORAL_TABLET | Freq: Two times a day (BID) | ORAL | Status: DC
  Administered 2016-09-11: 10 mg via ORAL
  Filled 2016-09-10: qty 1

## 2016-09-10 MED ORDER — ONDANSETRON HCL 4 MG/2ML IJ SOLN: 4.0000 mg | Freq: Four times a day (QID) | INTRAMUSCULAR | Status: DC | PRN

## 2016-09-10 MED ORDER — ASPIRIN EC 325 MG PO TBEC
325.0000 mg | DELAYED_RELEASE_TABLET | Freq: Every day | ORAL | Status: DC
  Administered 2016-09-11: 325 mg via ORAL
  Filled 2016-09-10: qty 1

## 2016-09-10 MED ORDER — TRIAMCINOLONE ACETONIDE 55 MCG/ACT NA AERO
2.0000 | INHALATION_SPRAY | Freq: Every day | NASAL | Status: DC | PRN
  Filled 2016-09-10: qty 21.6

## 2016-09-10 MED ORDER — MAGNESIUM OXIDE 400 (241.3 MG) MG PO TABS
400.0000 mg | ORAL_TABLET | Freq: Every day | ORAL | Status: DC
  Administered 2016-09-11: 400 mg via ORAL
  Filled 2016-09-10: qty 1

## 2016-09-10 MED ORDER — HYDROCHLOROTHIAZIDE 25 MG PO TABS
25.0000 mg | ORAL_TABLET | Freq: Every day | ORAL | Status: DC
  Administered 2016-09-11: 25 mg via ORAL
  Filled 2016-09-10: qty 1

## 2016-09-10 MED ORDER — SODIUM CHLORIDE 0.9% FLUSH
3.0000 mL | Freq: Two times a day (BID) | INTRAVENOUS | Status: DC
  Administered 2016-09-10: 3 mL via INTRAVENOUS

## 2016-09-10 MED ORDER — LORATADINE 10 MG PO TABS
10.0000 mg | ORAL_TABLET | Freq: Every day | ORAL | Status: DC
  Administered 2016-09-11: 10 mg via ORAL
  Filled 2016-09-10: qty 1

## 2016-09-10 MED ORDER — ACETAMINOPHEN 650 MG RE SUPP: 650.0000 mg | Freq: Four times a day (QID) | RECTAL | Status: DC | PRN

## 2016-09-10 MED ORDER — MAGNESIUM SULFATE 2 GM/50ML IV SOLN
2.0000 g | Freq: Once | INTRAVENOUS | Status: AC
  Administered 2016-09-10: 2 g via INTRAVENOUS
  Filled 2016-09-10: qty 50

## 2016-09-10 MED ORDER — VITAMIN D3 25 MCG (1000 UNIT) PO TABS
1000.0000 [IU] | ORAL_TABLET | Freq: Two times a day (BID) | ORAL | Status: DC
  Administered 2016-09-10 – 2016-09-11 (×2): 1000 [IU] via ORAL
  Filled 2016-09-10 (×4): qty 1

## 2016-09-10 MED ORDER — POTASSIUM CHLORIDE IN NACL 20-0.9 MEQ/L-% IV SOLN
INTRAVENOUS | Status: AC
  Filled 2016-09-10: qty 1000

## 2016-09-10 MED ORDER — BISACODYL 10 MG RE SUPP: 10.0000 mg | Freq: Every day | RECTAL | Status: DC | PRN

## 2016-09-10 NOTE — ED Notes (Signed)
Pt states that today she was up cooking and started feeling lightheaded, pt states that she had similar episode a few weeks ago and actually passed out, pt states that family checked her heart rate at that time and it was in the 40's. Pt states that she felt wiped out for a while, pt states that today she started feeling the same way and laid down and noticed that she was having a fluttering sensation in her chest, pt states that she has a hx of pvc's but does not have a cardiologist. No distress noted at this time, pt denies chest pain, skin warm and dry, daughter sitting at bedside talking with patient

## 2016-09-10 NOTE — ED Triage Notes (Signed)
Pt reports she was was feeling lightheaded and felt fluttering in her chest at about 1300 today states she checked her HR on her grandsons sports watch and it read 41bpm. HR 84 in triage pt reports lightheaded remains no pain.

## 2016-09-10 NOTE — Progress Notes (Signed)
Patient requested for benadryl 50 mg oral  for sleep since she takes it as needed at home. Dr. Earleen Newport notified with a new order for benadryl 50 mg  oral at bedtime. Will continue to monitor

## 2016-09-10 NOTE — ED Provider Notes (Signed)
[facility] Emergency Department Provider Note  ____________________________________________   I have reviewed the triage vital signs and the nursing notes.   HISTORY  Chief Complaint Palpitations    HPI Paula Lara is a 83 y.o. female who presents today complaining of feeling like she might pass out today. Patient has a history of some chronic kidney disease. And she states she has had frequent PVCs but never seen a doctor. 3 weeks ago, she felt lightheaded and actually passed out. She did not suffer any injury. She went back to her normal state of health today, she noted that her PVCs were coming much more frequent and she felt that she was going to pass out again. Patient has no chest pain shortness of breath nausea or vomiting, she feels slightly lightheaded at this time but better than she did earlier. She states usually when she gets these attacks, she lies down he gets better but today she still felt lightheaded afterwards. She is only passed out once from these palpitations. She has never seen a cardiologist for this or been on a Holter monitor although apparently she is talking about possibly doing this with her primary care doctor.      Past Medical History:  Diagnosis Date  . Diabetes mellitus without complication ([place])   . GERD (gastroesophageal reflux disease)   . Hypercholesterolemia   . Hypertension   . Renal disorder     There are no active problems to display for this patient.   History reviewed. No pertinent surgical history.  Prior to Admission medications   Medication Sig Start Date End Date Taking? Authorizing Provider  acetaminophen (TYLENOL) 650 MG CR tablet Take 1,300 mg by mouth 2 (two) times daily.    Historical Provider, MD  amoxicillin (AMOXIL) 500 MG capsule Take 1,000 mg by mouth 2 (two) times daily. X 10 days. 04/02/15   Historical Provider, MD  aspirin 325 MG EC tablet Take 325 mg by mouth daily.    Historical  Provider, MD  carvedilol (COREG) 6.25 MG tablet Take 6.25 mg by mouth 2 (two) times daily. 04/02/15   Historical Provider, MD  cephALEXin (KEFLEX) 500 MG capsule Take 1 capsule (500 mg total) by mouth 2 (two) times daily. 12/04/15   Jami L Hagler, PA-C  cholecalciferol (VITAMIN D) 1000 UNITS tablet Take 1,000 Units by mouth 2 (two) times daily.    Historical Provider, MD  diphenhydrAMINE (BENADRYL) 25 MG tablet Take 25 mg by mouth at bedtime as needed for itching, allergies or sleep.    Historical Provider, MD  Docusate Sodium 100 MG capsule Take 100 mg by mouth 2 (two) times daily.    Historical Provider, MD  enalapril (VASOTEC) 10 MG tablet Take 10-20 mg by mouth 2 (two) times daily. Take 1 tablet orally every morning and Take 2 tablets (20mg ) orally every morning. 02/19/15   Historical Provider, MD  enalapril-hydrochlorothiazide (VASERETIC) 10-25 MG per tablet Take 1 tablet by mouth every morning. 03/20/15   Historical Provider, MD  glipiZIDE (GLUCOTROL) 10 MG tablet Take 10 mg by mouth 2 (two) times daily before a meal. T 01/11/15   Historical Provider, MD  Inositol Niacinate (NIACIN FLUSH FREE) 500 MG CAPS Take 1,000 mg by mouth at bedtime.    Historical Provider, MD  loratadine (CLARITIN) 10 MG tablet Take 10 mg by mouth daily.    Historical Provider, MD  magic mouthwash SOLN Take 5 mLs by mouth 4 (four) times daily as needed for mouth pain. Please mix  81mL of each ingredient totaling 136mL 12/04/15   Jami L Hagler, PA-C  omeprazole (PRILOSEC) 40 MG capsule Take 40 mg by mouth daily. 04/02/15   Historical Provider, MD  Polyethylene Glycol 3350 (MIRALAX PO) Take 17 g by mouth daily.    Historical Provider, MD  triamcinolone (NASACORT ALLERGY 24HR CHILDREN) 55 MCG/ACT AERO nasal inhaler Place 2 sprays into the nose daily as needed (for allergies.).     Historical Provider, MD  triamcinolone cream (KENALOG) 0.1 % Apply 1 application topically 2 (two) times daily as needed (for rash.).    Historical  Provider, MD    Allergies Metformin and related and Metoprolol  No family history on file.  Social History Social History  Substance Use Topics  . Smoking status: Never Smoker  . Smokeless tobacco: Never Used  . Alcohol use No    Review of Systems Constitutional: No fever/chills Eyes: No visual changes. ENT: No sore throat. No stiff neck no neck pain Cardiovascular: Denies chest pain. Respiratory: Denies shortness of breath. Gastrointestinal:   no vomiting.  No diarrhea.  No constipation. Genitourinary: Negative for dysuria. Musculoskeletal: Negative lower extremity swelling Skin: Negative for rash. Neurological: Negative for severe headaches, focal weakness or numbness. 10-point ROS otherwise negative.  ____________________________________________   PHYSICAL EXAM:  VITAL SIGNS: ED Triage Vitals  Enc Vitals Group     BP 09/10/16 1456 (!) 168/93     Pulse Rate 09/10/16 1456 84     Resp 09/10/16 1456 20     Temp 09/10/16 1456 97.8 F (36.6 C)     Temp Source 09/10/16 1456 Oral     SpO2 09/10/16 1456 100 %     Weight 09/10/16 1458 146 lb (66.2 kg)     Height 09/10/16 1458 5\' 2"  (1.575 m)     Head Circumference --      Peak Flow --      Pain Score --      Pain Loc --      Pain Edu? --      Excl. in [place]? --     Constitutional: Alert and oriented. Well appearing and in no acute distress. Eyes: Conjunctivae are normal. PERRL. EOMI. Head: Atraumatic. Nose: No congestion/rhinnorhea. Mouth/Throat: Mucous membranes are moist.  Oropharynx non-erythematous. Neck: No stridor.   Nontender with no meningismus Cardiovascular: Normal rate, regular rhythm. Grossly normal heart sounds.  Good peripheral circulation. Respiratory: Normal respiratory effort.  No retractions. Lungs CTAB. Abdominal: Soft and nontender. No distention. No guarding no rebound Back:  There is no focal tenderness or step off.  there is no midline tenderness there are no lesions noted. there is no CVA  tenderness Musculoskeletal: No lower extremity tenderness, no upper extremity tenderness. No joint effusions, no DVT signs strong distal pulses no edema Neurologic:  Normal speech and language. No gross focal neurologic deficits are appreciated.  Skin:  Skin is warm, dry and intact. No rash noted. Psychiatric: Mood and affect are normal. Speech and behavior are normal.  ____________________________________________   LABS (all labs ordered are listed, but only abnormal results are displayed)  Labs Reviewed  CBC - Abnormal; Notable for the following:       Result Value   Hemoglobin 11.3 (*)    HCT 32.6 (*)    All other components within normal limits  COMPREHENSIVE METABOLIC PANEL - Abnormal; Notable for the following:    BUN 31 (*)    Creatinine, Ser 1.38 (*)    ALT 9 (*)    GFR  calc non Af Amer 35 (*)    GFR calc Af Amer 41 (*)    All other components within normal limits  TROPONIN I  TSH  MAGNESIUM   ____________________________________________  EKG  I personally interpreted any EKGs ordered by me or triage Sinus rhythm at 70 bpm, PVC noted. No acute ischemic changes ____________________________________________  RADIOLOGY  I reviewed any imaging ordered by me or triage that were performed during my shift and, if possible, patient and/or family made aware of any abnormal findings. ____________________________________________   PROCEDURES  Procedure(s) performed: None  Procedures  Critical Care performed: None  ____________________________________________   INITIAL IMPRESSION / ASSESSMENT AND PLAN / ED COURSE  Pertinent labs & imaging results that were available during my care of the patient were reviewed by me and considered in my medical decision making (see chart for details).  Patient quite well appearing at this time, blood work reassuring, did have a normal magnesium a few days ago as well. I'm concerned that she has now had a syncopal and had a  presyncopal event with a history of frequent PVCs. My concern is that perhaps she is having non-sustainable rhythms at home. Her monitor-show frequent PVCs here. I do feel that even though patient states she has had PVCs for many years she would benefit from observation on a monitor here in the hospital overnight and cardiology consult.    ____________________________________________   FINAL CLINICAL IMPRESSION(S) / ED DIAGNOSES  Final diagnoses:  None      This chart was dictated using voice recognition software.  Despite best efforts to proofread,  errors can occur which can change meaning.      Schuyler Amor, MD 09/10/16 [id number]

## 2016-09-10 NOTE — ED Notes (Signed)
Report given to [place]

## 2016-09-10 NOTE — H&P (Signed)
History and Physical    Paula Lara [id number] DOB: 17-Jul-1934 DOA: 09/10/2016  Referring physician: Dr. Burlene Arnt PCP: Pcp Not In System  Specialists: none  Chief Complaint: near syncope with palpitations  HPI: Paula Lara is a 83 y.o. female has a past medical history significant for CKD, HTN, and DM who presents with worsening palpitations and near syncope. Apparently, did syncopize 3-4 weeks ago. Now with recurrent sx's but no actual syncope. In the ER, telemetry reveals significant/frequent ectopy. She is now admitted. No fever. Denies CP or SOB. No N/V/D  Review of Systems: The patient denies anorexia, fever, weight loss,, vision loss, decreased hearing, hoarseness, chest pain, syncope, dyspnea on exertion, peripheral edema, balance deficits, hemoptysis, abdominal pain, melena, hematochezia, severe indigestion/heartburn, hematuria, incontinence, genital sores, muscle weakness, suspicious skin lesions, transient blindness, difficulty walking, depression, unusual weight change, abnormal bleeding, enlarged lymph nodes, angioedema, and breast masses.   Past Medical History:  Diagnosis Date  . Diabetes mellitus without complication ([place])   . GERD (gastroesophageal reflux disease)   . Hypercholesterolemia   . Hypertension   . Renal disorder    History reviewed. No pertinent surgical history. Social History:  reports that she has never smoked. She has never used smokeless tobacco. She reports that she does not drink alcohol or use drugs.  Allergies  Allergen Reactions  . Metformin And Related Other (See Comments)    Decreased renal function  . Metoprolol Other (See Comments)    fatique    History reviewed. No pertinent family history.  Prior to Admission medications   Medication Sig Start Date End Date Taking? Authorizing Provider  acetaminophen (TYLENOL) 650 MG CR tablet Take 1,300 mg by mouth 2 (two) times daily.    Historical Provider, MD  amoxicillin (AMOXIL)  500 MG capsule Take 1,000 mg by mouth 2 (two) times daily. X 10 days. 04/02/15   Historical Provider, MD  aspirin 325 MG EC tablet Take 325 mg by mouth daily.    Historical Provider, MD  carvedilol (COREG) 6.25 MG tablet Take 6.25 mg by mouth 2 (two) times daily. 04/02/15   Historical Provider, MD  cephALEXin (KEFLEX) 500 MG capsule Take 1 capsule (500 mg total) by mouth 2 (two) times daily. 12/04/15   Jami L Hagler, PA-C  cholecalciferol (VITAMIN D) 1000 UNITS tablet Take 1,000 Units by mouth 2 (two) times daily.    Historical Provider, MD  diphenhydrAMINE (BENADRYL) 25 MG tablet Take 25 mg by mouth at bedtime as needed for itching, allergies or sleep.    Historical Provider, MD  Docusate Sodium 100 MG capsule Take 100 mg by mouth 2 (two) times daily.    Historical Provider, MD  enalapril (VASOTEC) 10 MG tablet Take 10-20 mg by mouth 2 (two) times daily. Take 1 tablet orally every morning and Take 2 tablets (20mg ) orally every morning. 02/19/15   Historical Provider, MD  enalapril-hydrochlorothiazide (VASERETIC) 10-25 MG per tablet Take 1 tablet by mouth every morning. 03/20/15   Historical Provider, MD  glipiZIDE (GLUCOTROL) 10 MG tablet Take 10 mg by mouth 2 (two) times daily before a meal. T 01/11/15   Historical Provider, MD  Inositol Niacinate (NIACIN FLUSH FREE) 500 MG CAPS Take 1,000 mg by mouth at bedtime.    Historical Provider, MD  loratadine (CLARITIN) 10 MG tablet Take 10 mg by mouth daily.    Historical Provider, MD  magic mouthwash SOLN Take 5 mLs by mouth 4 (four) times daily as needed for mouth pain. Please mix 71mL of  each ingredient totaling 129mL 12/04/15   Jami L Hagler, PA-C  omeprazole (PRILOSEC) 40 MG capsule Take 40 mg by mouth daily. 04/02/15   Historical Provider, MD  Polyethylene Glycol 3350 (MIRALAX PO) Take 17 g by mouth daily.    Historical Provider, MD  triamcinolone (NASACORT ALLERGY 24HR CHILDREN) 55 MCG/ACT AERO nasal inhaler Place 2 sprays into the nose daily as needed (for  allergies.).     Historical Provider, MD  triamcinolone cream (KENALOG) 0.1 % Apply 1 application topically 2 (two) times daily as needed (for rash.).    Historical Provider, MD   Physical Exam: Vitals:   09/10/16 1456 09/10/16 1458  BP: (!) 168/93   Pulse: 84   Resp: 20   Temp: 97.8 F (36.6 C)   TempSrc: Oral   SpO2: 100%   Weight:  66.2 kg (146 lb)  Height:  5\' 2"  (1.575 m)     General:  No apparent distress, WDWN, [place]/AT  Eyes: PERRL, EOMI, no scleral icterus, conjunctiva clear  ENT: moist oropharynx without exudate, TM's benign, dentition good  Neck: supple, no lymphadenopathy. No bruits or thyromegaly  Cardiovascular: regular rate and rhythm with frequent premature beats without MRG; 2+ peripheral pulses, no JVD, no peripheral edema  Respiratory: CTA biL, good air movement without wheezing, rhonchi or crackled. Respiratory effort normal  Abdomen: soft, non tender to palpation, positive bowel sounds, no guarding, no rebound  Skin: no rashes or lesions  Musculoskeletal: normal bulk and tone, no joint swelling  Psychiatric: normal mood and affect, A&)X3  Neurologic: CN 2-12 grossly intact, Motor strength 5/5 in all 4 groups with symmetric DTR's and non-focal sensory exam  Labs on Admission:  Basic Metabolic Panel:  Recent Labs Lab 09/10/16 1511  NA 136  K 3.6  CL 101  CO2 25  GLUCOSE 99  BUN 31*  CREATININE 1.38*  CALCIUM 9.2   Liver Function Tests:  Recent Labs Lab 09/10/16 1511  AST 16  ALT 9*  ALKPHOS 64  BILITOT 0.5  PROT 7.8  ALBUMIN 4.1   No results for input(s): LIPASE, AMYLASE in the last 168 hours. No results for input(s): AMMONIA in the last 168 hours. CBC:  Recent Labs Lab 09/10/16 1511  WBC 6.4  HGB 11.3*  HCT 32.6*  MCV 84.7  PLT 318   Cardiac Enzymes:  Recent Labs Lab 09/10/16 1511  TROPONINI <0.03    BNP (last 3 results) No results for input(s): BNP in the last 8760 hours.  ProBNP (last 3 results) No results  for input(s): PROBNP in the last 8760 hours.  CBG: No results for input(s): GLUCAP in the last 168 hours.  Radiological Exams on Admission: Dg Chest 2 View  Result Date: 09/10/2016 CLINICAL DATA:  Chest pain. EXAM: CHEST  2 VIEW COMPARISON:  Radiographs of April 04, 2015. FINDINGS: The heart size and mediastinal contours are within normal limits. Both lungs are clear. No pneumothorax or pleural effusion is noted. Atherosclerosis of thoracic aorta is noted. The visualized skeletal structures are unremarkable. IMPRESSION: No active cardiopulmonary disease. Electronically Signed   By: Marijo Conception, M.D.   On: 09/10/2016 16:00    EKG: Independently reviewed.  Assessment/Plan Principal Problem:   Near syncope Active Problems:   Heart palpitations   HTN (hypertension)   CKD (chronic kidney disease) stage 3, GFR 30-59 ml/min   Will observe on telemetry and follow enzymes. Check echo and consult Cardiology. Increase dose of Coreg at this time. Empiric IV magnesium given in ER.  Follow sugars and renal fxn closely. Repeat labs in AM.  Diet: low salt Fluids: NS@75  with K+ DVT Prophylaxis: SQ Heparin  Code Status: FULL  Family Communication: yes  Disposition Plan: home  Time spent: 50 min

## 2016-09-11 LAB — COMPREHENSIVE METABOLIC PANEL
ALBUMIN: 3.5 g/dL (ref 3.5–5.0)
ALK PHOS: 55 U/L (ref 38–126)
ALT: 9 U/L — ABNORMAL LOW (ref 14–54)
ANION GAP: 7 (ref 5–15)
AST: 14 U/L — AB (ref 15–41)
BILIRUBIN TOTAL: 0.6 mg/dL (ref 0.3–1.2)
BUN: 28 mg/dL — AB (ref 6–20)
CO2: 25 mmol/L (ref 22–32)
Calcium: 8.8 mg/dL — ABNORMAL LOW (ref 8.9–10.3)
Chloride: 106 mmol/L (ref 101–111)
Creatinine, Ser: 1.09 mg/dL — ABNORMAL HIGH (ref 0.44–1.00)
GFR calc Af Amer: 54 mL/min — ABNORMAL LOW (ref >60)
GFR calc non Af Amer: 47 mL/min — ABNORMAL LOW (ref >60)
GLUCOSE: 108 mg/dL — AB (ref 65–99)
POTASSIUM: 4 mmol/L (ref 3.5–5.1)
SODIUM: 138 mmol/L (ref 135–145)
Total Protein: 6.7 g/dL (ref 6.5–8.1)

## 2016-09-11 LAB — MAGNESIUM: MAGNESIUM: 1.8 mg/dL (ref 1.7–2.4)

## 2016-09-11 LAB — CBC
HEMATOCRIT: 31.7 % — AB (ref 35.0–47.0)
HEMOGLOBIN: 10.8 g/dL — AB (ref 12.0–16.0)
MCH: 28.9 pg (ref 26.0–34.0)
MCHC: 34.1 g/dL (ref 32.0–36.0)
MCV: 84.8 fL (ref 80.0–100.0)
Platelets: 265 10*3/uL (ref 150–440)
RBC: 3.74 MIL/uL — ABNORMAL LOW (ref 3.80–5.20)
RDW: 13 % (ref 11.5–14.5)
WBC: 5 10*3/uL (ref 3.6–11.0)

## 2016-09-11 LAB — TROPONIN I

## 2016-09-11 LAB — GLUCOSE, CAPILLARY
Glucose-Capillary: 106 mg/dL — ABNORMAL HIGH (ref 65–99)
Glucose-Capillary: 118 mg/dL — ABNORMAL HIGH (ref 65–99)

## 2016-09-11 NOTE — Progress Notes (Signed)
Paula Lara is a 83 y.o. female  [id number]  Primary Cardiologist: Neoma Laming Reason for Consultation: Dizziness and palpitation and syncope  HPI: This 83 year old white female with a past medical history of diabetes hyperlipidemia hypertension renal insufficiency presented to the hospital with episode where she had palpitations and dizziness and fainted.   Review of Systems: No chest pain   Past Medical History:  Diagnosis Date  . Diabetes mellitus without complication ([place])   . GERD (gastroesophageal reflux disease)   . Hypercholesterolemia   . Hypertension   . Renal disorder     Medications Prior to Admission  Medication Sig Dispense Refill  . acetaminophen (TYLENOL) 650 MG CR tablet Take 1,300 mg by mouth 2 (two) times daily.    Marland Kitchen aspirin 325 MG EC tablet Take 325 mg by mouth daily.    . carvedilol (COREG) 6.25 MG tablet Take 6.25 mg by mouth 2 (two) times daily.  0  . cholecalciferol (VITAMIN D) 1000 UNITS tablet Take 1,000 Units by mouth 2 (two) times daily.    . diphenhydrAMINE (BENADRYL) 25 MG tablet Take 50 mg by mouth at bedtime as needed for itching, allergies or sleep.     Mariane Baumgarten Sodium 100 MG capsule Take 100 mg by mouth 2 (two) times daily.    . enalapril (VASOTEC) 10 MG tablet Take 10-20 mg by mouth 2 (two) times daily. Take 1 tablet orally every morning and Take 2 tablets (20mg ) orally every morning.  1  . enalapril-hydrochlorothiazide (VASERETIC) 10-25 MG per tablet Take 1 tablet by mouth every morning.  3  . glipiZIDE (GLUCOTROL) 10 MG tablet Take 10 mg by mouth 2 (two) times daily before a meal. T  3  . HYDROcodone-acetaminophen (NORCO/VICODIN) 5-325 MG tablet Take 1 tablet by mouth every 6 (six) hours as needed for moderate pain.    . Inositol Niacinate (NIACIN FLUSH FREE) 500 MG CAPS Take 1,000 mg by mouth at bedtime.    . magnesium oxide (MAG-OX) 400 MG tablet Take 400 mg by mouth daily.    Marland Kitchen omeprazole (PRILOSEC) 40 MG capsule Take 40 mg by  mouth daily.  0  . Polyethylene Glycol 3350 (MIRALAX PO) Take 17 g by mouth daily.    Marland Kitchen triamcinolone (NASACORT ALLERGY 24HR CHILDREN) 55 MCG/ACT AERO nasal inhaler Place 2 sprays into the nose daily as needed (for allergies.).     Marland Kitchen triamcinolone cream (KENALOG) 0.1 % Apply 1 application topically 2 (two) times daily as needed (for rash.).    Marland Kitchen loratadine (CLARITIN) 10 MG tablet Take 10 mg by mouth daily.    . magic mouthwash SOLN Take 5 mLs by mouth 4 (four) times daily as needed for mouth pain. Please mix 28mL of each ingredient totaling 155mL 180 mL 0     . aspirin  325 mg Oral Daily  . carvedilol  12.5 mg Oral BID WC  . cholecalciferol  1,000 Units Oral BID  . diphenhydrAMINE  50 mg Oral QHS  . docusate sodium  100 mg Oral BID  . enalapril  10 mg Oral Daily   And  . hydrochlorothiazide  25 mg Oral Daily  . glipiZIDE  10 mg Oral BID AC  . heparin  5,000 Units Subcutaneous Q8H  . insulin aspart  0-9 Units Subcutaneous TID WC  . loratadine  10 mg Oral Daily  . magnesium oxide  400 mg Oral Daily  . niacin  1,000 mg Oral QHS  . pantoprazole  40 mg Oral Daily  .  sodium chloride flush  3 mL Intravenous Q12H    Infusions: . 0.9 % NaCl with KCl 20 mEq / L 75 mL/hr at 09/11/16 0657    Allergies  Allergen Reactions  . Metformin And Related Other (See Comments)    Decreased renal function  . Metoprolol Other (See Comments)    fatique    Social History   Social History  . Marital status: Married    Spouse name: N/A  . Number of children: N/A  . Years of education: N/A   Occupational History  . Not on file.   Social History Main Topics  . Smoking status: Never Smoker  . Smokeless tobacco: Never Used  . Alcohol use No  . Drug use: No  . Sexual activity: Not on file   Other Topics Concern  . Not on file   Social History Narrative  . No narrative on file    History reviewed. No pertinent family history.  PHYSICAL EXAM: Vitals:   09/11/16 0500 09/11/16 0742   BP: (!) 163/64 (!) 157/87  Pulse: 72 71  Resp:  18  Temp: 98.4 F (36.9 C) 98.4 F (36.9 C)     Intake/Output Summary (Last 24 hours) at 09/11/16 0825 Last data filed at 09/11/16 0700  Gross per 24 hour  Intake           988.75 ml  Output              600 ml  Net           388.75 ml    General:  Well appearing. No respiratory difficulty HEENT: normal Neck: supple. no JVD. Carotids 2+ bilat; no bruits. No lymphadenopathy or thryomegaly appreciated. Cor: PMI nondisplaced. Regular rate & rhythm. No rubs, gallops or murmurs. Lungs: clear Abdomen: soft, nontender, nondistended. No hepatosplenomegaly. No bruits or masses. Good bowel sounds. Extremities: no cyanosis, clubbing, rash, edema Neuro: alert & oriented x 3, cranial nerves grossly intact. moves all 4 extremities w/o difficulty. Affect pleasant.  ECG: Normal sinus rhythm with nonspecific ST-T changes  Results for orders placed or performed during the hospital encounter of 09/10/16 (from the past 24 hour(s))  CBC     Status: Abnormal   Collection Time: 09/10/16  3:11 PM  Result Value Ref Range   WBC 6.4 3.6 - 11.0 K/uL   RBC 3.85 3.80 - 5.20 MIL/uL   Hemoglobin 11.3 (L) 12.0 - 16.0 g/dL   HCT 32.6 (L) 35.0 - 47.0 %   MCV 84.7 80.0 - 100.0 fL   MCH 29.4 26.0 - 34.0 pg   MCHC 34.7 32.0 - 36.0 g/dL   RDW 12.8 11.5 - 14.5 %   Platelets 318 150 - 440 K/uL  Comprehensive metabolic panel     Status: Abnormal   Collection Time: 09/10/16  3:11 PM  Result Value Ref Range   Sodium 136 135 - 145 mmol/L   Potassium 3.6 3.5 - 5.1 mmol/L   Chloride 101 101 - 111 mmol/L   CO2 25 22 - 32 mmol/L   Glucose, Bld 99 65 - 99 mg/dL   BUN 31 (H) 6 - 20 mg/dL   Creatinine, Ser 1.38 (H) 0.44 - 1.00 mg/dL   Calcium 9.2 8.9 - 10.3 mg/dL   Total Protein 7.8 6.5 - 8.1 g/dL   Albumin 4.1 3.5 - 5.0 g/dL   AST 16 15 - 41 U/L   ALT 9 (L) 14 - 54 U/L   Alkaline Phosphatase 64 38 - 126 U/L  Total Bilirubin 0.5 0.3 - 1.2 mg/dL   GFR calc non  Af Amer 35 (L) >60 mL/min   GFR calc Af Amer 41 (L) >60 mL/min   Anion gap 10 5 - 15  Troponin I     Status: None   Collection Time: 09/10/16  3:11 PM  Result Value Ref Range   Troponin I <0.03 <0.03 ng/mL  TSH     Status: None   Collection Time: 09/10/16  3:11 PM  Result Value Ref Range   TSH 2.168 0.350 - 4.500 uIU/mL  Magnesium     Status: None   Collection Time: 09/10/16  3:11 PM  Result Value Ref Range   Magnesium 1.7 1.7 - 2.4 mg/dL  Troponin I     Status: None   Collection Time: 09/10/16  7:23 PM  Result Value Ref Range   Troponin I <0.03 <0.03 ng/mL  Glucose, capillary     Status: Abnormal   Collection Time: 09/10/16  9:54 PM  Result Value Ref Range   Glucose-Capillary 124 (H) 65 - 99 mg/dL   Comment 1 Notify RN   Troponin I     Status: None   Collection Time: 09/11/16 12:07 AM  Result Value Ref Range   Troponin I <0.03 <0.03 ng/mL  Troponin I     Status: None   Collection Time: 09/11/16  6:27 AM  Result Value Ref Range   Troponin I <0.03 <0.03 ng/mL  Magnesium     Status: None   Collection Time: 09/11/16  6:27 AM  Result Value Ref Range   Magnesium 1.8 1.7 - 2.4 mg/dL  Comprehensive metabolic panel     Status: Abnormal   Collection Time: 09/11/16  6:27 AM  Result Value Ref Range   Sodium 138 135 - 145 mmol/L   Potassium 4.0 3.5 - 5.1 mmol/L   Chloride 106 101 - 111 mmol/L   CO2 25 22 - 32 mmol/L   Glucose, Bld 108 (H) 65 - 99 mg/dL   BUN 28 (H) 6 - 20 mg/dL   Creatinine, Ser 1.09 (H) 0.44 - 1.00 mg/dL   Calcium 8.8 (L) 8.9 - 10.3 mg/dL   Total Protein 6.7 6.5 - 8.1 g/dL   Albumin 3.5 3.5 - 5.0 g/dL   AST 14 (L) 15 - 41 U/L   ALT 9 (L) 14 - 54 U/L   Alkaline Phosphatase 55 38 - 126 U/L   Total Bilirubin 0.6 0.3 - 1.2 mg/dL   GFR calc non Af Amer 47 (L) >60 mL/min   GFR calc Af Amer 54 (L) >60 mL/min   Anion gap 7 5 - 15  CBC     Status: Abnormal   Collection Time: 09/11/16  6:27 AM  Result Value Ref Range   WBC 5.0 3.6 - 11.0 K/uL   RBC 3.74 (L)  3.80 - 5.20 MIL/uL   Hemoglobin 10.8 (L) 12.0 - 16.0 g/dL   HCT 31.7 (L) 35.0 - 47.0 %   MCV 84.8 80.0 - 100.0 fL   MCH 28.9 26.0 - 34.0 pg   MCHC 34.1 32.0 - 36.0 g/dL   RDW 13.0 11.5 - 14.5 %   Platelets 265 150 - 440 K/uL  Glucose, capillary     Status: Abnormal   Collection Time: 09/11/16  7:36 AM  Result Value Ref Range   Glucose-Capillary 106 (H) 65 - 99 mg/dL   Dg Chest 2 View  Result Date: 09/10/2016 CLINICAL DATA:  Chest pain. EXAM: CHEST  2 VIEW  COMPARISON:  Radiographs of April 04, 2015. FINDINGS: The heart size and mediastinal contours are within normal limits. Both lungs are clear. No pneumothorax or pleural effusion is noted. Atherosclerosis of thoracic aorta is noted. The visualized skeletal structures are unremarkable. IMPRESSION: No active cardiopulmonary disease. Electronically Signed   By: Marijo Conception, M.D.   On: 09/10/2016 16:00     ASSESSMENT AND PLAN: Presyncope with dizziness and palpitation and MIs been ruled out. Patient can be discharged with a follow-up in the office tomorrow at 9 AM and will do echocardiogram and a stress test as well as carotid Doppler as an outpatient.  Amijah Timothy A

## 2016-09-11 NOTE — Discharge Summary (Signed)
[facility] NAME: Paula Lara    MR#:  [id number]  DATE OF BIRTH:  [birth year]  DATE OF ADMISSION:  09/10/2016 ADMITTING PHYSICIAN: Idelle Crouch, MD  DATE OF DISCHARGE: 09/11/16  PRIMARY CARE PHYSICIAN: Pcp Not In System    ADMISSION DIAGNOSIS:  Palpitations [R00.2] Pre-syncope [R55]  DISCHARGE DIAGNOSIS:  Near syncope Dehydration (acute on chronic)-improved  SECONDARY DIAGNOSIS:   Past Medical History:  Diagnosis Date  . Diabetes mellitus without complication ([place])   . GERD (gastroesophageal reflux disease)   . Hypercholesterolemia   . Hypertension   . Renal disorder     HOSPITAL COURSE:   Paula Lara is a 83 y.o. female has a past medical history significant for CKD, HTN, and DM who presents with worsening palpitations and near syncope. Apparently, did syncopize 3-4 weeks ago. Now with recurrent sx's but no actual syncope. In the ER, telemetry reveals significant/frequent ectopy. She is now admitted. No fever. Denies CP or SOB. No N/V/D  *Near syncope due to PVC and dehydraiton -improved bp stable,  Pt feels better. Tele SR, lytes ok -creat improved after hydration -seen by dr Humphrey Rolls. Ok to d/c home and f/u as out pt tomorrow   * Heart palpitations-resolved  *  HTN (hypertension) -cont ace, hydrochlorthiazide and coreg  *  CKD (chronic kidney disease) stage 3, GFR 30-59 ml/min -creat improved after hydration  D/w pt and dter  d/c home  CONSULTS OBTAINED:  Treatment Team:  Dionisio David, MD  DRUG ALLERGIES:   Allergies  Allergen Reactions  . Metformin And Related Other (See Comments)    Decreased renal function  . Metoprolol Other (See Comments)    fatique    DISCHARGE MEDICATIONS:   Current Discharge Medication List    CONTINUE these medications which have NOT CHANGED   Details  acetaminophen (TYLENOL) 650 MG CR tablet Take 1,300 mg by mouth 2 (two) times daily.     aspirin 325 MG EC tablet Take 325 mg by mouth daily.    carvedilol (COREG) 6.25 MG tablet Take 6.25 mg by mouth 2 (two) times daily. Refills: 0    cholecalciferol (VITAMIN D) 1000 UNITS tablet Take 1,000 Units by mouth 2 (two) times daily.    diphenhydrAMINE (BENADRYL) 25 MG tablet Take 50 mg by mouth at bedtime as needed for itching, allergies or sleep.     Docusate Sodium 100 MG capsule Take 100 mg by mouth 2 (two) times daily.    enalapril (VASOTEC) 10 MG tablet Take 10-20 mg by mouth 2 (two) times daily. Take 1 tablet orally every morning and Take 2 tablets (20mg ) orally every morning. Refills: 1    enalapril-hydrochlorothiazide (VASERETIC) 10-25 MG per tablet Take 1 tablet by mouth every morning. Refills: 3    glipiZIDE (GLUCOTROL) 10 MG tablet Take 10 mg by mouth 2 (two) times daily before a meal. T Refills: 3    HYDROcodone-acetaminophen (NORCO/VICODIN) 5-325 MG tablet Take 1 tablet by mouth every 6 (six) hours as needed for moderate pain.    Inositol Niacinate (NIACIN FLUSH FREE) 500 MG CAPS Take 1,000 mg by mouth at bedtime.    magnesium oxide (MAG-OX) 400 MG tablet Take 400 mg by mouth daily.    omeprazole (PRILOSEC) 40 MG capsule Take 40 mg by mouth daily. Refills: 0    Polyethylene Glycol 3350 (MIRALAX PO) Take 17 g by mouth daily.    triamcinolone (NASACORT ALLERGY 24HR CHILDREN) 55 MCG/ACT AERO nasal  inhaler Place 2 sprays into the nose daily as needed (for allergies.).     triamcinolone cream (KENALOG) 0.1 % Apply 1 application topically 2 (two) times daily as needed (for rash.).    loratadine (CLARITIN) 10 MG tablet Take 10 mg by mouth daily.    magic mouthwash SOLN Take 5 mLs by mouth 4 (four) times daily as needed for mouth pain. Please mix 25mL of each ingredient totaling 143mL Qty: 180 mL, Refills: 0        If you experience worsening of your admission symptoms, develop shortness of breath, life threatening emergency, suicidal or homicidal thoughts you  must seek medical attention immediately by calling 911 or calling your MD immediately  if symptoms less severe.  You Must read complete instructions/literature along with all the possible adverse reactions/side effects for all the Medicines you take and that have been prescribed to you. Take any new Medicines after you have completely understood and accept all the possible adverse reactions/side effects.   Please note  You were cared for by a hospitalist during your hospital stay. If you have any questions about your discharge medications or the care you received while you were in the hospital after you are discharged, you can call the unit and asked to speak with the hospitalist on call if the hospitalist that took care of you is not available. Once you are discharged, your primary care physician will handle any further medical issues. Please note that NO REFILLS for any discharge medications will be authorized once you are discharged, as it is imperative that you return to your primary care physician (or establish a relationship with a primary care physician if you do not have one) for your aftercare needs so that they can reassess your need for medications and monitor your lab values. Today   SUBJECTIVE   I feel a lot better  VITAL SIGNS:  Blood pressure 125/60, pulse 65, temperature 97.6 F (36.4 C), temperature source Oral, resp. rate 20, height 5\' 5"  (1.651 m), weight 64.7 kg (142 lb 9.6 oz), SpO2 95 %.  I/O:   Intake/Output Summary (Last 24 hours) at 09/11/16 1305 Last data filed at 09/11/16 1035  Gross per 24 hour  Intake          1228.75 ml  Output             1300 ml  Net           -71.25 ml    PHYSICAL EXAMINATION:  GENERAL:  83 y.o.-year-old patient lying in the bed with no acute distress.  EYES: Pupils equal, round, reactive to light and accommodation. No scleral icterus. Extraocular muscles intact.  HEENT: Head atraumatic, normocephalic. Oropharynx and nasopharynx clear.   NECK:  Supple, no jugular venous distention. No thyroid enlargement, no tenderness.  LUNGS: Normal breath sounds bilaterally, no wheezing, rales,rhonchi or crepitation. No use of accessory muscles of respiration.  CARDIOVASCULAR: S1, S2 normal. No murmurs, rubs, or gallops.  ABDOMEN: Soft, non-tender, non-distended. Bowel sounds present. No organomegaly or mass.  EXTREMITIES: No pedal edema, cyanosis, or clubbing.  NEUROLOGIC: Cranial nerves II through XII are intact. Muscle strength 5/5 in all extremities. Sensation intact. Gait not checked.  PSYCHIATRIC: The patient is alert and oriented x 3.  SKIN: No obvious rash, lesion, or ulcer.   DATA REVIEW:   CBC   Recent Labs Lab 09/11/16 0627  WBC 5.0  HGB 10.8*  HCT 31.7*  PLT 265    Chemistries   Recent Labs  Lab 09/11/16 0627  NA 138  K 4.0  CL 106  CO2 25  GLUCOSE 108*  BUN 28*  CREATININE 1.09*  CALCIUM 8.8*  MG 1.8  AST 14*  ALT 9*  ALKPHOS 55  BILITOT 0.6    Microbiology Results   No results found for this or any previous visit (from the past 240 hour(s)).  RADIOLOGY:  Dg Chest 2 View  Result Date: 09/10/2016 CLINICAL DATA:  Chest pain. EXAM: CHEST  2 VIEW COMPARISON:  Radiographs of April 04, 2015. FINDINGS: The heart size and mediastinal contours are within normal limits. Both lungs are clear. No pneumothorax or pleural effusion is noted. Atherosclerosis of thoracic aorta is noted. The visualized skeletal structures are unremarkable. IMPRESSION: No active cardiopulmonary disease. Electronically Signed   By: Marijo Conception, M.D.   On: 09/10/2016 16:00     Management plans discussed with the patient, family and they are in agreement.  CODE STATUS:     Code Status Orders        Start     Ordered   09/10/16 1823  Full code  Continuous     09/10/16 1823    Code Status History    Date Active Date Inactive Code Status Order ID Comments User Context   This patient has a current code status but no  historical code status.      TOTAL TIME TAKING CARE OF THIS PATIENT: 40  minutes.    Janet Humphreys M.D on 09/11/2016 at 1:05 PM  Between 7am to 6pm - Pager - [phone] After 6pm go to [email or web address] - password [email or web address] [facility]  Office  [phone]  CC: Primary care physician; Pcp Not In System

## 2016-09-11 NOTE — Care Management Obs Status (Signed)
[facility] NOTIFICATION   Patient Details  Name: Paula Lara MRN: [id number] Date of Birth: [birth year]   Medicare Observation Status Notification Given:  No  < 24 hours    Katrina Stack, RN 09/11/2016, 2:54 PM

## 2016-09-11 NOTE — Progress Notes (Signed)
Patient is being discharged to home.  She will see Dr. Humphrey Rolls tomorrow for a work-up.  She is comfortable with this.

## 2017-07-23 IMAGING — US US RENAL
1 series · 14 of 25 positions shown · non-contrast
Comparison: None.

CLINICAL DATA: Stage III chronic renal disease

EXAM:
RENAL / URINARY TRACT ULTRASOUND COMPLETE

[Series 1: us renal · 0.26mm/px · 14 of 34 slices shown]
[im 1/34]
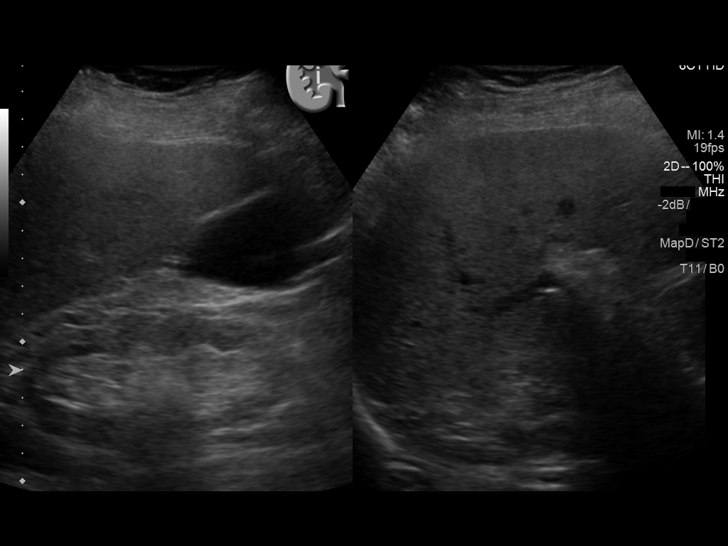
[im 3/34]
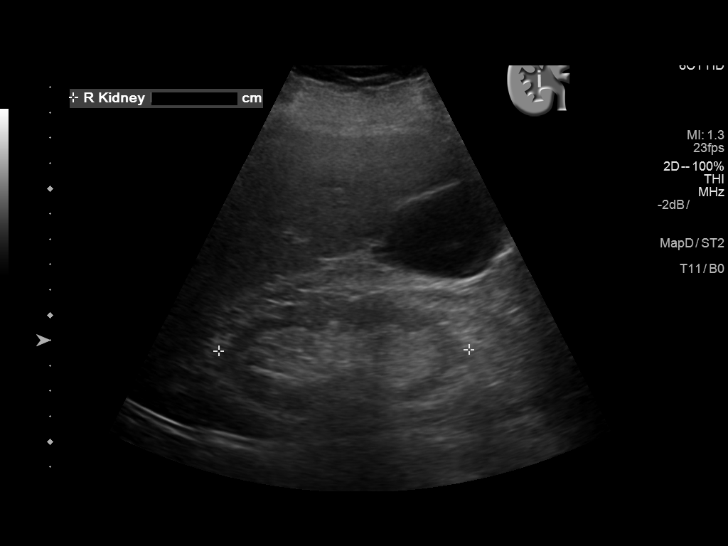
[im 6/34]
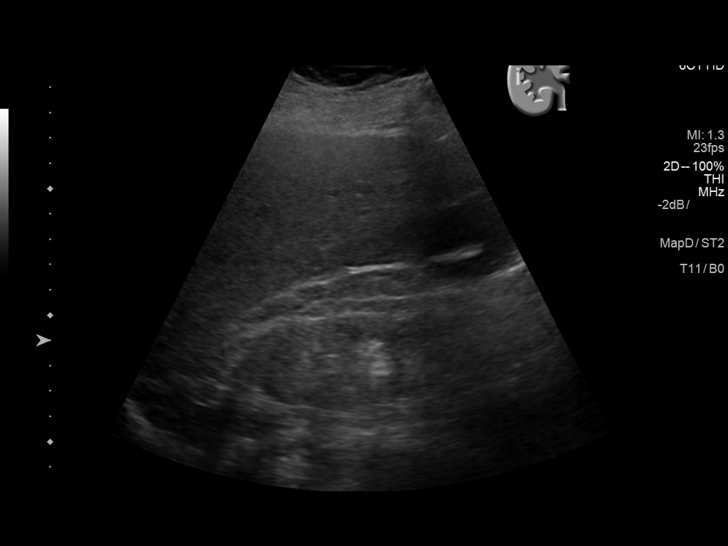
[im 9/34]
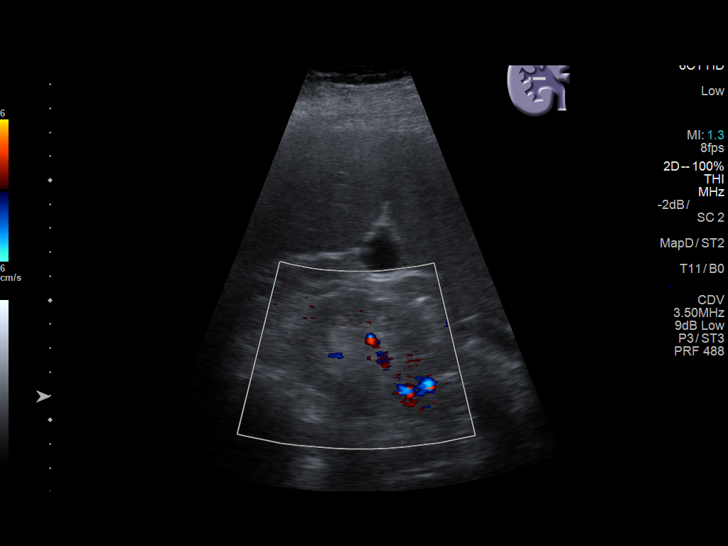
[im 12/34]
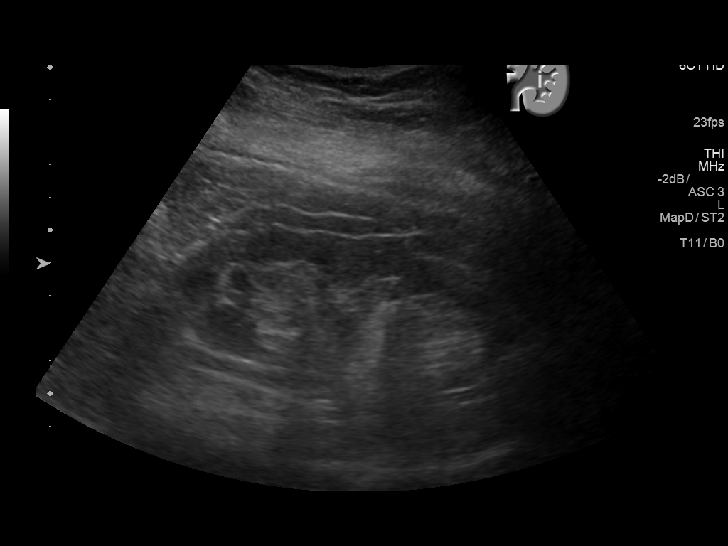
[im 13/34]
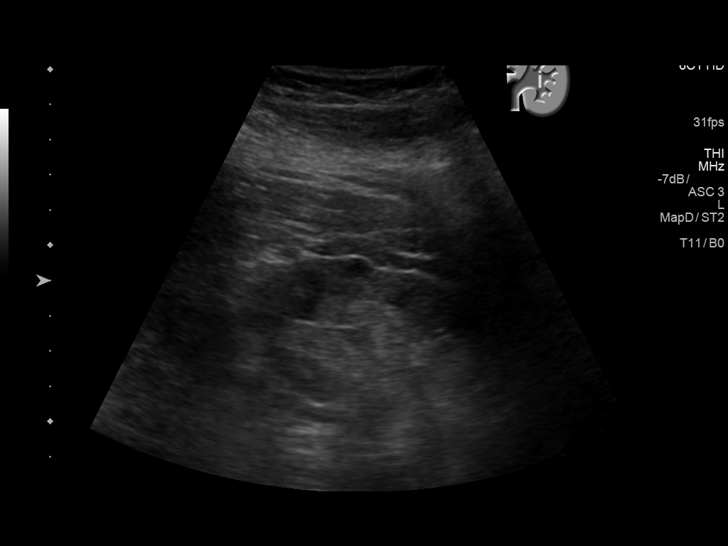
[im 16/34]
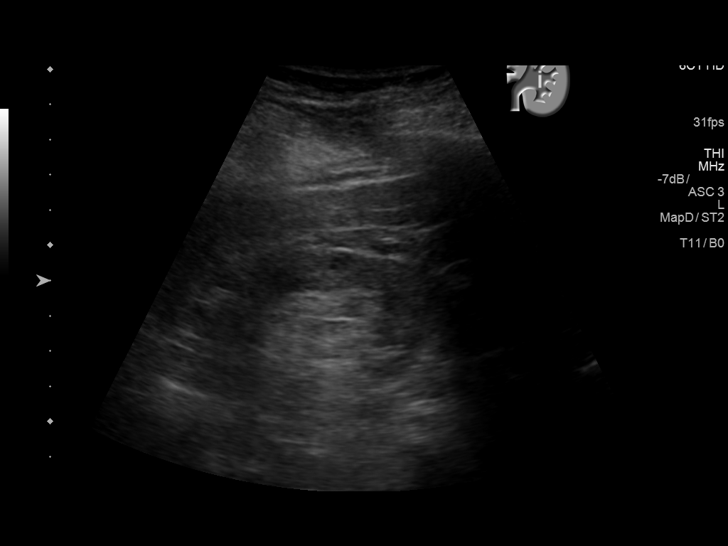
[im 18/34]
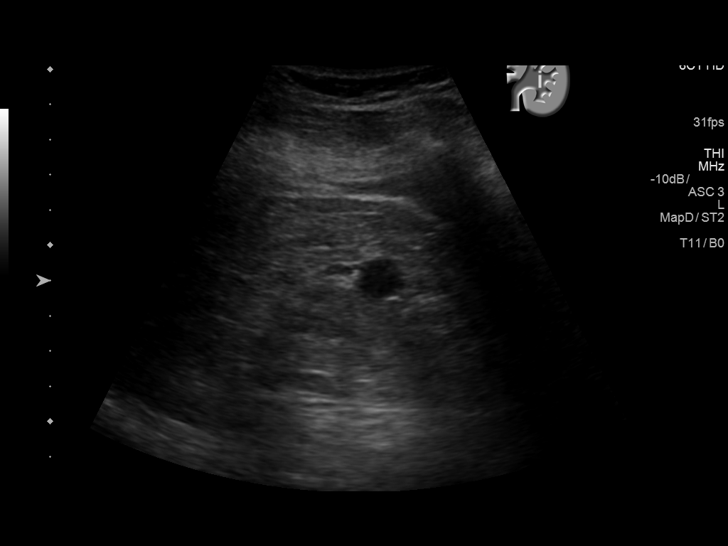
[im 21/34]
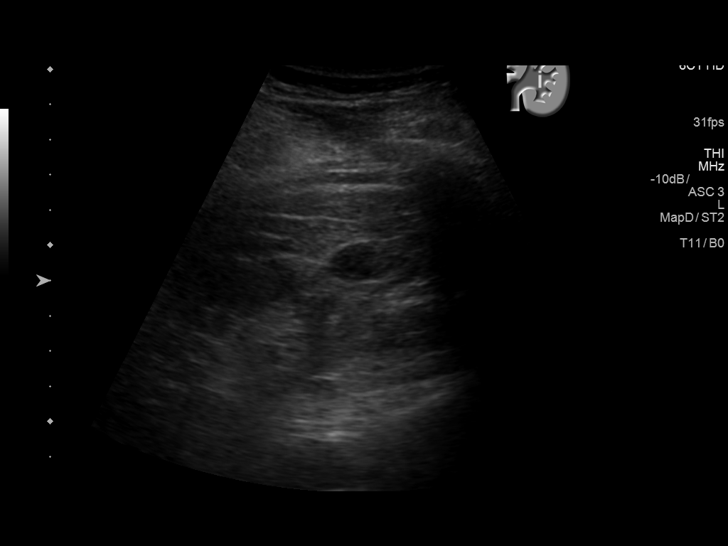
[im 23/34]
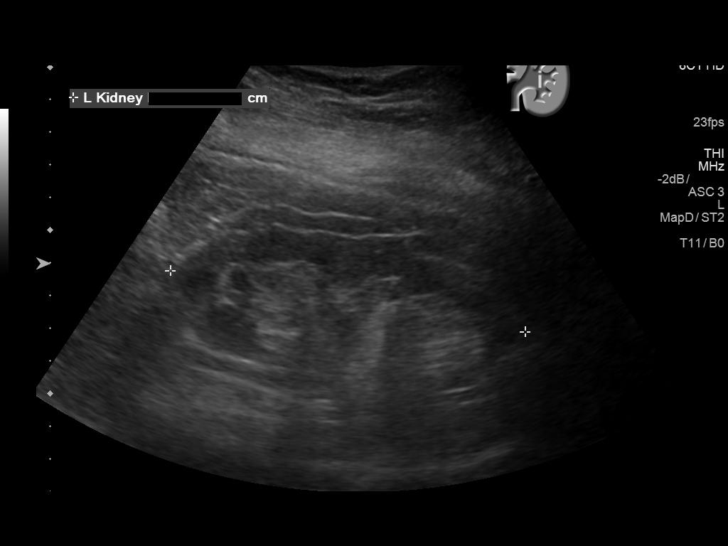
[im 25/34]
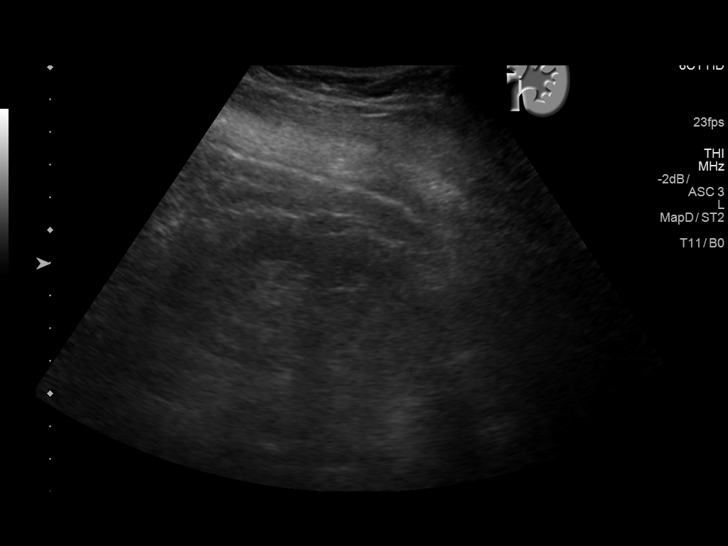
[im 28/34]
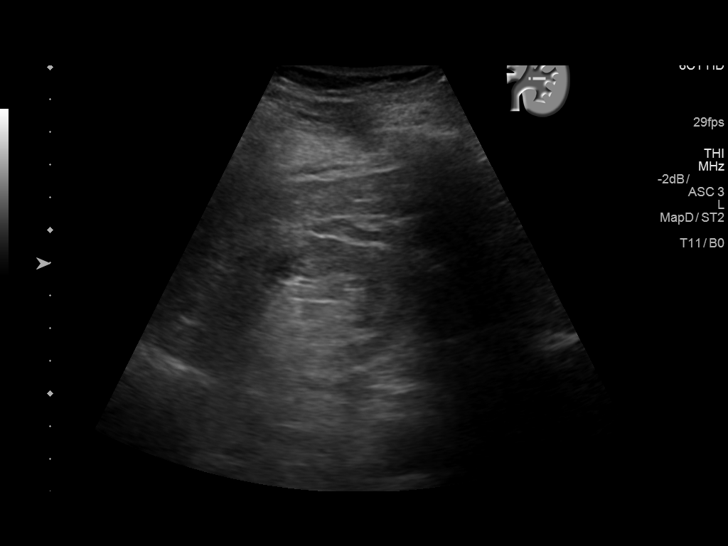
[im 31/34]
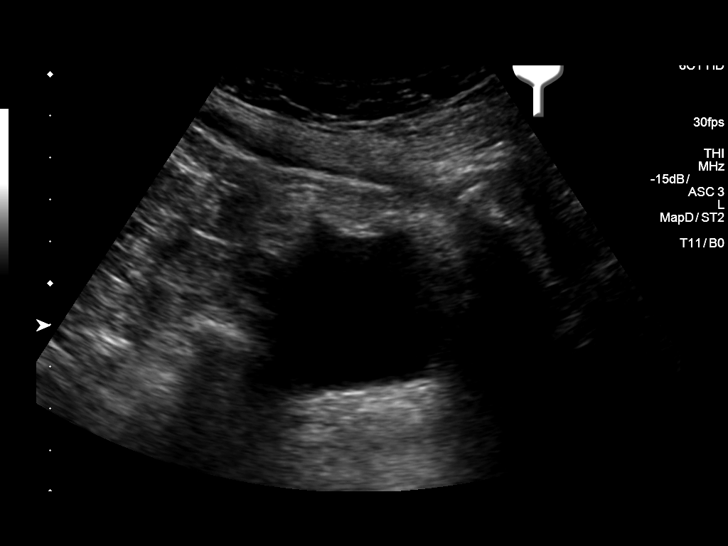
[im 34/34]
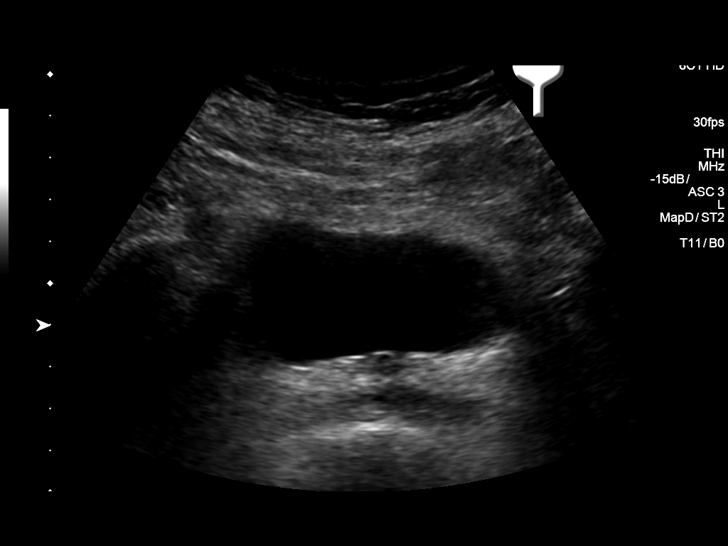

[14 of 25 positions shown; findings below may reference images not displayed]

FINDINGS: Right Kidney:

Length: 9.9 cm. Echogenicity is increased. There is renal cortical
thinning. No mass, perinephric fluid, or hydronephrosis visualized.
No sonographically demonstrable calculus or ureterectasis.

Left Kidney:

Length: 11.0 cm. Echogenicity is increased. There is renal cortical
thinning. No perinephric fluid or hydronephrosis visualized. There
is a cyst in the upper pole left kidney region measuring 0.8 x 0.6 x
0.7 cm. A nearby second cyst measures 1.3 x 1.2 x 1.5 cm. No
sonographically demonstrable calculus or ureterectasis.

Bladder:

Appears normal for degree of bladder distention.
IMPRESSION: Each kidney shows increased echogenicity and renal cortical
thinning, findings indicative of medical renal disease. No
obstructing focus identified on either side. There are small cysts
in the left kidney.

## 2018-04-01 IMAGING — CR DG CHEST 2V
1 series · 2 of 2 positions shown · non-contrast
Comparison: Radiographs April 04, 2015.

CLINICAL DATA: Chest pain.

EXAM:
CHEST  2 VIEW

[Series 1: w chest pa · 0.14mm/px · 2 of 2 slices shown]
[im 1/2]
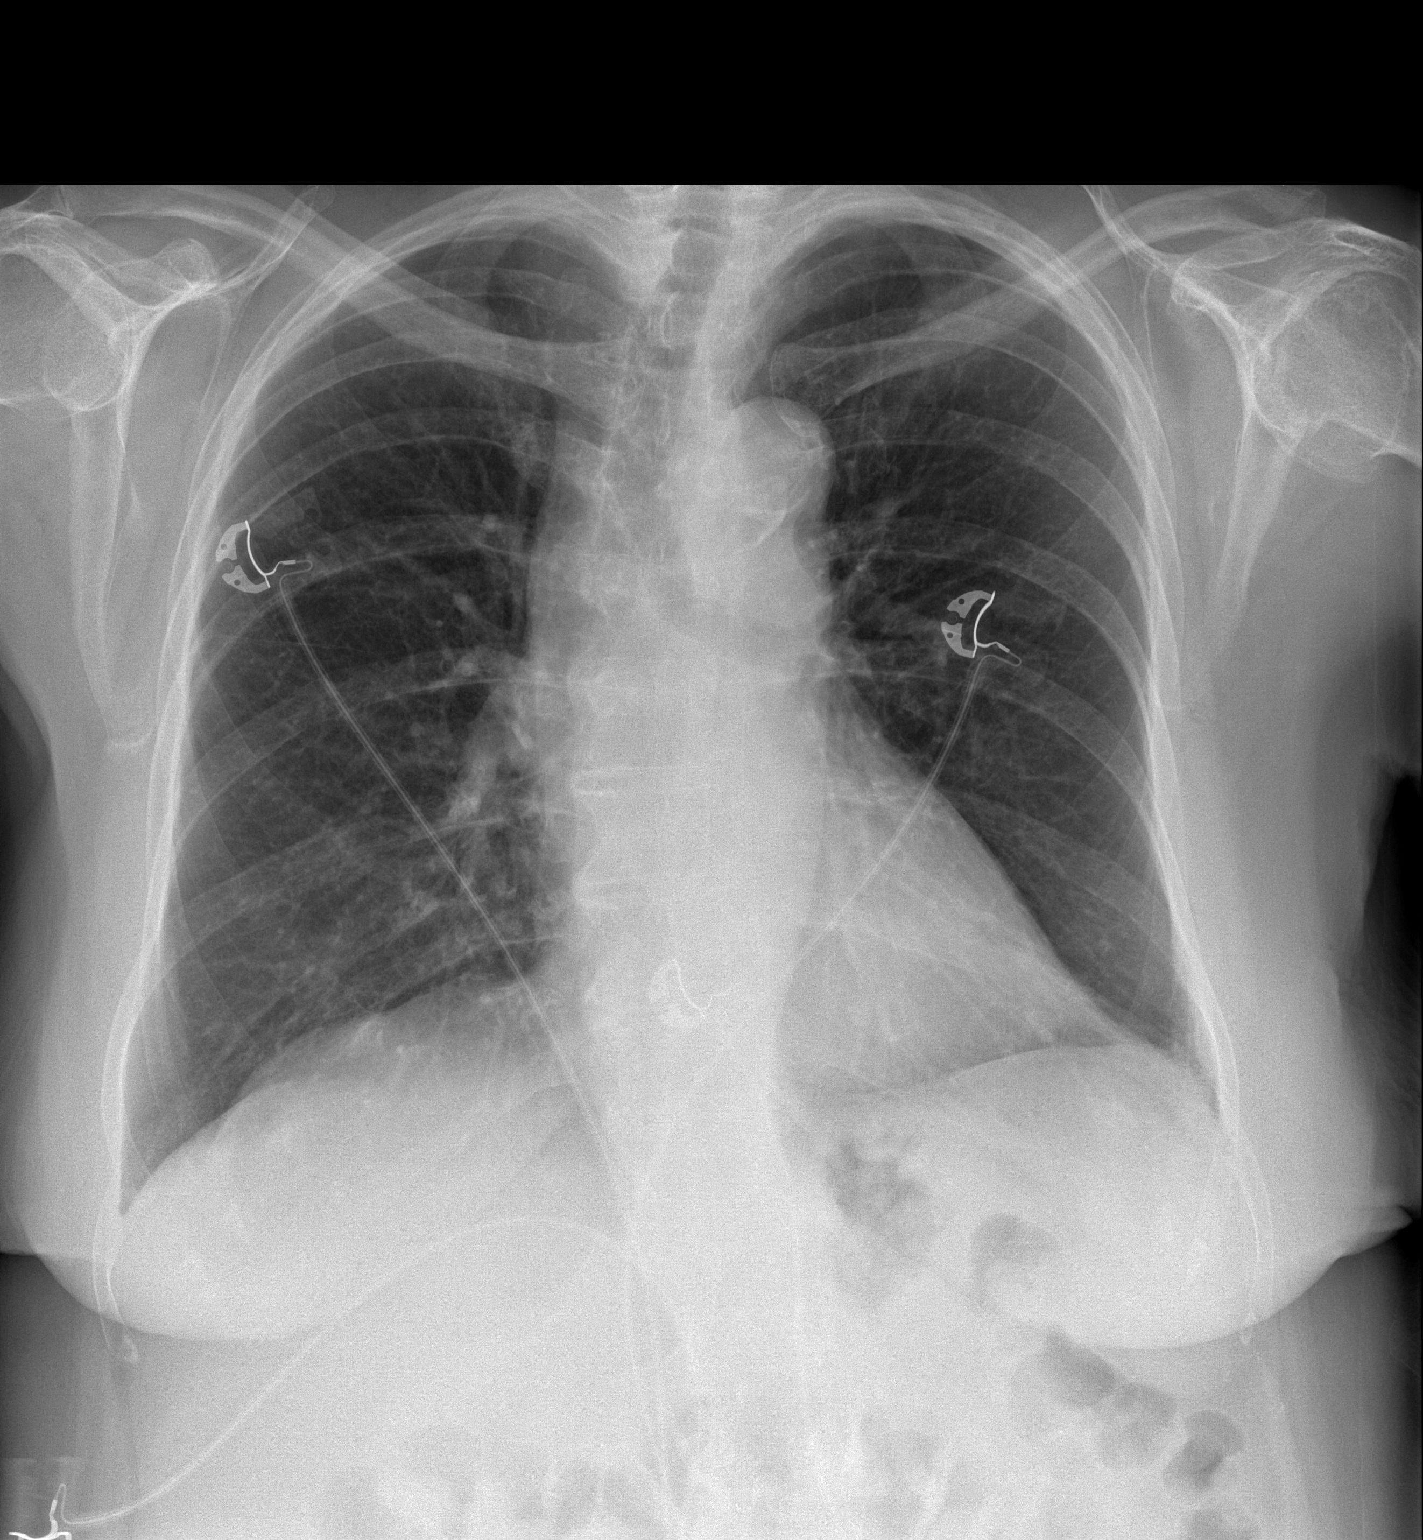
[im 2/2]
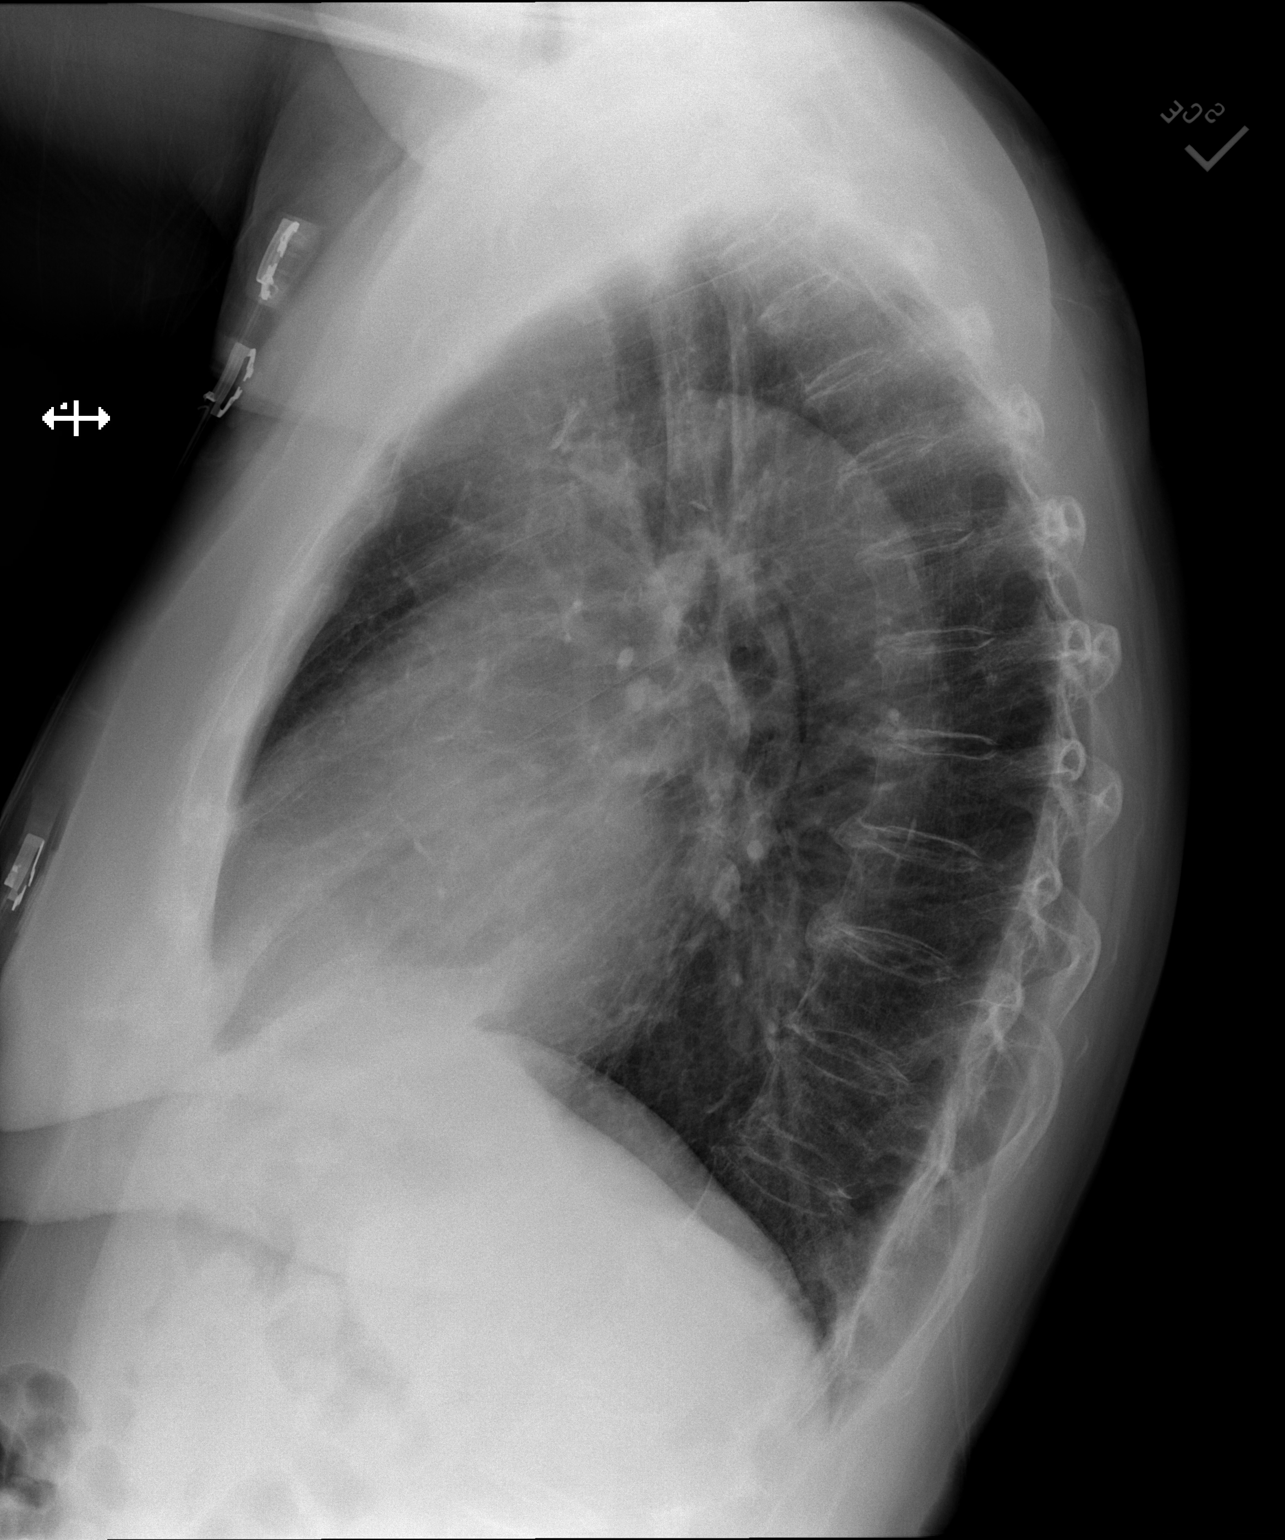

[2 of 2 positions shown; findings below may reference images not displayed]

FINDINGS: The heart size and mediastinal contours are within normal limits.
Both lungs are clear. No pneumothorax or pleural effusion is noted.
Atherosclerosis of thoracic aorta is noted. The visualized skeletal
structures are unremarkable.
IMPRESSION: No active cardiopulmonary disease.

## 2019-05-19 ENCOUNTER — Encounter: Payer: Self-pay | Admitting: *Deleted

## 2019-05-27 ENCOUNTER — Other Ambulatory Visit
Admission: RE | Admit: 2019-05-27 | Discharge: 2019-05-27 | Disposition: A | Discharge status: Home / Self Care | Payer: Medicare Other | Source: Ambulatory Visit | Attending: Ophthalmology | Admitting: Ophthalmology

## 2019-05-27 ENCOUNTER — Other Ambulatory Visit: Payer: Self-pay

## 2019-05-27 DIAGNOSIS — Z01812 Encounter for preprocedural laboratory examination: Secondary | ICD-10-CM | POA: Diagnosis present

## 2019-05-27 DIAGNOSIS — Z20828 Contact with and (suspected) exposure to other viral communicable diseases: Secondary | ICD-10-CM | POA: Insufficient documentation

## 2019-05-27 LAB — SARS CORONAVIRUS 2 (TAT 6-24 HRS): SARS Coronavirus 2: NEGATIVE

## 2019-05-29 ENCOUNTER — Encounter: Payer: Self-pay | Admitting: Anesthesiology

## 2019-05-30 ENCOUNTER — Other Ambulatory Visit: Payer: Self-pay

## 2019-05-30 ENCOUNTER — Ambulatory Visit
Admission: RE | Admit: 2019-05-30 | Discharge: 2019-05-30 | Disposition: A | Discharge status: Home / Self Care | Payer: Medicare Other | Attending: Ophthalmology | Admitting: Ophthalmology

## 2019-05-30 ENCOUNTER — Encounter: Payer: Self-pay | Admitting: *Deleted

## 2019-05-30 ENCOUNTER — Ambulatory Visit: Payer: Medicare Other | Admitting: Anesthesiology

## 2019-05-30 ENCOUNTER — Encounter
Admission: RE | Disposition: A | Discharge status: Home / Self Care | Payer: Self-pay | Source: Home / Self Care | Attending: Ophthalmology

## 2019-05-30 DIAGNOSIS — Z91048 Other nonmedicinal substance allergy status: Secondary | ICD-10-CM | POA: Insufficient documentation

## 2019-05-30 DIAGNOSIS — H9190 Unspecified hearing loss, unspecified ear: Secondary | ICD-10-CM | POA: Insufficient documentation

## 2019-05-30 DIAGNOSIS — H2511 Age-related nuclear cataract, right eye: Secondary | ICD-10-CM | POA: Diagnosis present

## 2019-05-30 DIAGNOSIS — D631 Anemia in chronic kidney disease: Secondary | ICD-10-CM | POA: Diagnosis not present

## 2019-05-30 DIAGNOSIS — N183 Chronic kidney disease, stage 3 unspecified: Secondary | ICD-10-CM | POA: Diagnosis not present

## 2019-05-30 DIAGNOSIS — I129 Hypertensive chronic kidney disease with stage 1 through stage 4 chronic kidney disease, or unspecified chronic kidney disease: Secondary | ICD-10-CM | POA: Insufficient documentation

## 2019-05-30 DIAGNOSIS — E1140 Type 2 diabetes mellitus with diabetic neuropathy, unspecified: Secondary | ICD-10-CM | POA: Diagnosis not present

## 2019-05-30 DIAGNOSIS — M1990 Unspecified osteoarthritis, unspecified site: Secondary | ICD-10-CM | POA: Diagnosis not present

## 2019-05-30 DIAGNOSIS — K219 Gastro-esophageal reflux disease without esophagitis: Secondary | ICD-10-CM | POA: Insufficient documentation

## 2019-05-30 DIAGNOSIS — E1122 Type 2 diabetes mellitus with diabetic chronic kidney disease: Secondary | ICD-10-CM | POA: Diagnosis not present

## 2019-05-30 DIAGNOSIS — Z85828 Personal history of other malignant neoplasm of skin: Secondary | ICD-10-CM | POA: Insufficient documentation

## 2019-05-30 DIAGNOSIS — E1136 Type 2 diabetes mellitus with diabetic cataract: Secondary | ICD-10-CM | POA: Insufficient documentation

## 2019-05-30 DIAGNOSIS — R011 Cardiac murmur, unspecified: Secondary | ICD-10-CM | POA: Insufficient documentation

## 2019-05-30 HISTORY — DX: Chronic kidney disease, unspecified: N18.9

## 2019-05-30 HISTORY — DX: Unspecified malignant neoplasm of skin, unspecified: C44.90

## 2019-05-30 HISTORY — DX: Polyneuropathy, unspecified: G62.9

## 2019-05-30 HISTORY — DX: Cardiac arrhythmia, unspecified: I49.9

## 2019-05-30 HISTORY — DX: Cardiac murmur, unspecified: R01.1

## 2019-05-30 HISTORY — DX: Unspecified hearing loss, unspecified ear: H91.90

## 2019-05-30 HISTORY — DX: Unspecified osteoarthritis, unspecified site: M19.90

## 2019-05-30 HISTORY — DX: Localized edema: R60.0

## 2019-05-30 HISTORY — PX: CATARACT EXTRACTION W/PHACO: SHX586

## 2019-05-30 HISTORY — DX: Anemia, unspecified: D64.9

## 2019-05-30 LAB — GLUCOSE, CAPILLARY
Glucose-Capillary: 128 mg/dL — ABNORMAL HIGH (ref 70–99)
Glucose-Capillary: 128 mg/dL — ABNORMAL HIGH (ref 70–99)

## 2019-05-30 SURGERY — PHACOEMULSIFICATION, CATARACT, WITH IOL INSERTION
Anesthesia: Monitor Anesthesia Care | Site: Eye | Laterality: Right

## 2019-05-30 MED ORDER — POVIDONE-IODINE 5 % OP SOLN
OPHTHALMIC | Status: DC | PRN
  Administered 2019-05-30: 1 via OPHTHALMIC

## 2019-05-30 MED ORDER — TETRACAINE HCL 0.5 % OP SOLN
1.0000 [drp] | OPHTHALMIC | Status: AC | PRN
  Administered 2019-05-30 (×2): 1 [drp] via OPHTHALMIC

## 2019-05-30 MED ORDER — MOXIFLOXACIN HCL 0.5 % OP SOLN
OPHTHALMIC | Status: AC
  Filled 2019-05-30: qty 3

## 2019-05-30 MED ORDER — ONDANSETRON HCL 4 MG/2ML IJ SOLN
INTRAMUSCULAR | Status: DC | PRN
  Administered 2019-05-30: 4 mg via INTRAVENOUS

## 2019-05-30 MED ORDER — LIDOCAINE HCL (PF) 4 % IJ SOLN
INTRAOCULAR | Status: DC | PRN
  Administered 2019-05-30: 4 mL via OPHTHALMIC

## 2019-05-30 MED ORDER — MOXIFLOXACIN HCL 0.5 % OP SOLN: 1.0000 [drp] | OPHTHALMIC | Status: DC | PRN

## 2019-05-30 MED ORDER — SODIUM CHLORIDE 0.9 % IV SOLN
INTRAVENOUS | Status: DC
  Administered 2019-05-30 (×2): via INTRAVENOUS

## 2019-05-30 MED ORDER — MIDAZOLAM HCL 2 MG/2ML IJ SOLN
INTRAMUSCULAR | Status: AC
  Filled 2019-05-30: qty 2

## 2019-05-30 MED ORDER — FENTANYL CITRATE (PF) 100 MCG/2ML IJ SOLN
INTRAMUSCULAR | Status: AC
  Filled 2019-05-30: qty 2

## 2019-05-30 MED ORDER — ONDANSETRON HCL 4 MG/2ML IJ SOLN
INTRAMUSCULAR | Status: AC
Start: 2019-05-30 — End: ?
  Filled 2019-05-30: qty 2

## 2019-05-30 MED ORDER — TETRACAINE HCL 0.5 % OP SOLN
OPHTHALMIC | Status: AC
  Administered 2019-05-30: 1 [drp] via OPHTHALMIC
  Filled 2019-05-30: qty 4

## 2019-05-30 MED ORDER — ARMC OPHTHALMIC DILATING DROPS
1.0000 "application " | OPHTHALMIC | Status: AC
  Administered 2019-05-30 (×3): 1 via OPHTHALMIC

## 2019-05-30 MED ORDER — MIDAZOLAM HCL 2 MG/2ML IJ SOLN
INTRAMUSCULAR | Status: DC | PRN
  Administered 2019-05-30 (×2): 0.5 mg via INTRAVENOUS

## 2019-05-30 MED ORDER — MOXIFLOXACIN HCL 0.5 % OP SOLN
OPHTHALMIC | Status: DC | PRN
  Administered 2019-05-30: 0.2 mL via OPHTHALMIC

## 2019-05-30 MED ORDER — ARMC OPHTHALMIC DILATING DROPS
OPHTHALMIC | Status: AC
  Filled 2019-05-30: qty 0.5

## 2019-05-30 MED ORDER — CARBACHOL 0.01 % IO SOLN
INTRAOCULAR | Status: DC | PRN
  Administered 2019-05-30: 0.5 mL via INTRAOCULAR

## 2019-05-30 MED ORDER — EPINEPHRINE PF 1 MG/ML IJ SOLN
INTRAOCULAR | Status: DC | PRN
  Administered 2019-05-30: 10:00:00 via OPHTHALMIC

## 2019-05-30 MED ORDER — NA CHONDROIT SULF-NA HYALURON 40-17 MG/ML IO SOLN
INTRAOCULAR | Status: DC | PRN
  Administered 2019-05-30: 1 mL via INTRAOCULAR

## 2019-05-30 SURGICAL SUPPLY — 16 items
GLOVE BIO SURGEON STRL SZ8 (GLOVE) ×3 IMPLANT
GLOVE BIOGEL M 6.5 STRL (GLOVE) ×3 IMPLANT
GLOVE SURG LX 8.0 MICRO (GLOVE) ×2
GLOVE SURG LX STRL 8.0 MICRO (GLOVE) ×1 IMPLANT
GOWN STRL REUS W/ TWL LRG LVL3 (GOWN DISPOSABLE) ×2 IMPLANT
GOWN STRL REUS W/TWL LRG LVL3 (GOWN DISPOSABLE) ×4
LABEL CATARACT MEDS ST (LABEL) ×3 IMPLANT
LENS IOL TECNIS ITEC 22.5 (Intraocular Lens) ×2 IMPLANT
PACK CATARACT (MISCELLANEOUS) ×3 IMPLANT
PACK CATARACT BRASINGTON LX (MISCELLANEOUS) ×3 IMPLANT
PACK EYE AFTER SURG (MISCELLANEOUS) ×3 IMPLANT
SOL BSS BAG (MISCELLANEOUS) ×3
SOLUTION BSS BAG (MISCELLANEOUS) ×1 IMPLANT
SYR 5ML LL (SYRINGE) ×3 IMPLANT
WATER STERILE IRR 250ML POUR (IV SOLUTION) ×3 IMPLANT
WIPE NON LINTING 3.25X3.25 (MISCELLANEOUS) ×3 IMPLANT

## 2019-05-30 NOTE — Anesthesia Postprocedure Evaluation (Signed)
Anesthesia Post Note  Patient: Paula Lara  Procedure(s) Performed: CATARACT EXTRACTION PHACO AND INTRAOCULAR LENS PLACEMENT ([facility]) (Right Eye)  Patient location during evaluation: Phase II Anesthesia Type: MAC Level of consciousness: awake, oriented, awake and alert and patient cooperative Pain management: pain level controlled Vital Signs Assessment: post-procedure vital signs reviewed and stable Respiratory status: spontaneous breathing Cardiovascular status: stable Postop Assessment: able to ambulate and no apparent nausea or vomiting     Last Vitals:  Vitals:   05/30/19 0758 05/30/19 1004  BP: (!) 147/94 139/60  Pulse: 91 82  Resp:  16  Temp:  (!) 36.1 C  SpO2: 100% 100%    Last Pain:  Vitals:   05/30/19 1004  TempSrc: Temporal  PainSc: 0-No pain                 Jamiesha Victoria Fletcher-Harrison

## 2019-05-30 NOTE — Anesthesia Preprocedure Evaluation (Signed)
Anesthesia Evaluation  Patient identified by MRN, date of birth, ID band Patient awake    Reviewed: Allergy & Precautions, NPO status , Patient's Chart, lab work & pertinent test results, reviewed documented beta blocker date and time   Airway Mallampati: II  TM Distance: >3 FB     Dental  (+) Chipped   Pulmonary           Cardiovascular hypertension, Pt. on medications and Pt. on home beta blockers + dysrhythmias + Valvular Problems/Murmurs      Neuro/Psych    GI/Hepatic GERD  Controlled,  Endo/Other  diabetes, Type 2  Renal/GU Renal disease     Musculoskeletal  (+) Arthritis ,   Abdominal   Peds  Hematology  (+) anemia ,   Anesthesia Other Findings   Reproductive/Obstetrics                             Anesthesia Physical Anesthesia Plan  ASA: III  Anesthesia Plan: MAC   Post-op Pain Management:    Induction: Intravenous  PONV Risk Score and Plan:   Airway Management Planned:   Additional Equipment:   Intra-op Plan:   Post-operative Plan:   Informed Consent: I have reviewed the patients History and Physical, chart, labs and discussed the procedure including the risks, benefits and alternatives for the proposed anesthesia with the patient or authorized representative who has indicated his/her understanding and acceptance.       Plan Discussed with: CRNA  Anesthesia Plan Comments:         Anesthesia Quick Evaluation

## 2019-05-30 NOTE — Transfer of Care (Signed)
Immediate Anesthesia Transfer of Care Note  Patient: Paula Lara  Procedure(s) Performed: CATARACT EXTRACTION PHACO AND INTRAOCULAR LENS PLACEMENT ([facility]) (Right Eye)  Patient Location: PACU  Anesthesia Type:MAC  Level of Consciousness: awake, alert , oriented and patient cooperative  Airway & Oxygen Therapy: Patient Spontanous Breathing  Post-op Assessment: Report given to RN and Post -op Vital signs reviewed and stable  Post vital signs: Reviewed and stable  Last Vitals:  Vitals Value Taken Time  BP    Temp 36.1 C 05/30/19 1004  Pulse 82 05/30/19 1004  Resp 16 05/30/19 1004  SpO2 100 % 05/30/19 1004    Last Pain:  Vitals:   05/30/19 1004  TempSrc: Temporal  PainSc: 0-No pain         Complications: No apparent anesthesia complications

## 2019-05-30 NOTE — Anesthesia Post-op Follow-up Note (Signed)
Anesthesia QCDR form completed.        

## 2019-05-30 NOTE — H&P (Signed)
All labs reviewed. Abnormal studies sent to patients PCP when indicated.  Previous H&P reviewed, patient examined, there are NO CHANGES.  Paula Cala Porfilio10/23/20209:37 AM

## 2019-05-30 NOTE — Discharge Instructions (Addendum)
Eye Surgery Discharge Instructions  Expect mild scratchy sensation or mild soreness. DO NOT RUB YOUR EYE!  The day of surgery:  Minimal physical activity, but bed rest is not required  No reading, computer work, or close hand work  No bending, lifting, or straining.  May watch TV  For 24 hours:  No driving, legal decisions, or alcoholic beverages  Safety precautions  Eat anything you prefer: It is better to start with liquids, then soup then solid foods.  Solar shield eyeglasses should be worn for comfort in the sunlight/patch while sleeping  Resume all regular medications including aspirin or Coumadin if these were discontinued prior to surgery. You may shower, bathe, shave, or wash your hair. Tylenol may be taken for mild discomfort. Follow Dr. Inda Coke eye drop instruction sheet as reviewed.  Call your doctor if you experience significant pain, nausea, or vomiting, fever > 101 or other signs of infection. [phone] or [phone] Specific instructions:  Follow-up Information    Birder Robson, MD Follow up.   Specialty: Ophthalmology Why: 05-30-19 @ 3:20 pm Contact information: [address] [place] [address] [phone]

## 2019-05-30 NOTE — Op Note (Signed)
PREOPERATIVE DIAGNOSIS:  Nuclear sclerotic cataract of the right eye.   POSTOPERATIVE DIAGNOSIS:  NUCLEAR SCLEROTIC CATARACT RIGHT EYE   OPERATIVE PROCEDURE: Procedure(s): CATARACT EXTRACTION PHACO AND INTRAOCULAR LENS PLACEMENT (IOC)   SURGEON:  Birder Robson, MD.   ANESTHESIA:  Anesthesiologist: Gunnar Bulla, MD CRNA: Kelton Pillar, CRNA  1.      Managed anesthesia care. 2.      0.40ml of Shugarcaine was instilled in the eye following the paracentesis.   COMPLICATIONS:  None.   TECHNIQUE:   Stop and chop   DESCRIPTION OF PROCEDURE:  The patient was examined and consented in the preoperative holding area where the aforementioned topical anesthesia was applied to the right eye and then brought back to the Operating Room where the right eye was prepped and draped in the usual sterile ophthalmic fashion and a lid speculum was placed. A paracentesis was created with the side port blade and the anterior chamber was filled with viscoelastic. A near clear corneal incision was performed with the steel keratome. A continuous curvilinear capsulorrhexis was performed with a cystotome followed by the capsulorrhexis forceps. Hydrodissection and hydrodelineation were carried out with BSS on a blunt cannula. The lens was removed in a stop and chop  technique and the remaining cortical material was removed with the irrigation-aspiration handpiece. The capsular bag was inflated with viscoelastic and the Technis ZCB00  lens was placed in the capsular bag without complication. The remaining viscoelastic was removed from the eye with the irrigation-aspiration handpiece. The wounds were hydrated. The anterior chamber was flushed with Miostat and the eye was inflated to physiologic pressure. 0.72ml of Vigamox was placed in the anterior chamber. The wounds were found to be water tight. The eye was dressed with Vigamox. The patient was given protective glasses to wear throughout the day and a shield  with which to sleep tonight. The patient was also given drops with which to begin a drop regimen today and will follow-up with me in one day. Implant Name Type Inv. Item Serial No. Manufacturer Lot No. LRB No. Used Action  LENS IOL DIOP 22.5 - [id number] Intraocular Lens LENS IOL DIOP 22.5 [id number] [place]  Right 1 Implanted   Procedure(s) with comments: CATARACT EXTRACTION PHACO AND INTRAOCULAR LENS PLACEMENT ([facility]) (Right) - [place] 01:00.1 CDE 10.99 Fluid pack Lot # [id number]  Electronically signed: Birder Robson 05/30/2019 10:05 AM

## 2019-06-18 ENCOUNTER — Encounter: Payer: Self-pay | Admitting: *Deleted

## 2019-06-24 ENCOUNTER — Other Ambulatory Visit: Payer: Self-pay

## 2019-06-24 ENCOUNTER — Other Ambulatory Visit
Admission: RE | Admit: 2019-06-24 | Discharge: 2019-06-24 | Disposition: A | Discharge status: Home / Self Care | Payer: Medicare Other | Source: Ambulatory Visit | Attending: Ophthalmology | Admitting: Ophthalmology

## 2019-06-24 DIAGNOSIS — Z20828 Contact with and (suspected) exposure to other viral communicable diseases: Secondary | ICD-10-CM | POA: Insufficient documentation

## 2019-06-24 DIAGNOSIS — Z01812 Encounter for preprocedural laboratory examination: Secondary | ICD-10-CM | POA: Diagnosis present

## 2019-06-24 LAB — SARS CORONAVIRUS 2 (TAT 6-24 HRS): SARS Coronavirus 2: NEGATIVE

## 2019-06-27 ENCOUNTER — Ambulatory Visit: Payer: Medicare Other | Admitting: Certified Registered"

## 2019-06-27 ENCOUNTER — Other Ambulatory Visit: Payer: Self-pay

## 2019-06-27 ENCOUNTER — Encounter
Admission: RE | Disposition: A | Discharge status: Home / Self Care | Payer: Self-pay | Source: Home / Self Care | Attending: Ophthalmology

## 2019-06-27 ENCOUNTER — Ambulatory Visit
Admission: RE | Admit: 2019-06-27 | Discharge: 2019-06-27 | Disposition: A | Discharge status: Home / Self Care | Payer: Medicare Other | Attending: Ophthalmology | Admitting: Ophthalmology

## 2019-06-27 ENCOUNTER — Encounter: Payer: Self-pay | Admitting: *Deleted

## 2019-06-27 DIAGNOSIS — Z79899 Other long term (current) drug therapy: Secondary | ICD-10-CM | POA: Diagnosis not present

## 2019-06-27 DIAGNOSIS — E1122 Type 2 diabetes mellitus with diabetic chronic kidney disease: Secondary | ICD-10-CM | POA: Diagnosis not present

## 2019-06-27 DIAGNOSIS — Z7982 Long term (current) use of aspirin: Secondary | ICD-10-CM | POA: Diagnosis not present

## 2019-06-27 DIAGNOSIS — I129 Hypertensive chronic kidney disease with stage 1 through stage 4 chronic kidney disease, or unspecified chronic kidney disease: Secondary | ICD-10-CM | POA: Diagnosis not present

## 2019-06-27 DIAGNOSIS — Z85828 Personal history of other malignant neoplasm of skin: Secondary | ICD-10-CM | POA: Diagnosis not present

## 2019-06-27 DIAGNOSIS — H2512 Age-related nuclear cataract, left eye: Secondary | ICD-10-CM | POA: Diagnosis present

## 2019-06-27 DIAGNOSIS — E1140 Type 2 diabetes mellitus with diabetic neuropathy, unspecified: Secondary | ICD-10-CM | POA: Diagnosis not present

## 2019-06-27 DIAGNOSIS — E1136 Type 2 diabetes mellitus with diabetic cataract: Secondary | ICD-10-CM | POA: Diagnosis not present

## 2019-06-27 DIAGNOSIS — N183 Chronic kidney disease, stage 3 unspecified: Secondary | ICD-10-CM | POA: Diagnosis not present

## 2019-06-27 DIAGNOSIS — Z7984 Long term (current) use of oral hypoglycemic drugs: Secondary | ICD-10-CM | POA: Diagnosis not present

## 2019-06-27 HISTORY — PX: CATARACT EXTRACTION W/PHACO: SHX586

## 2019-06-27 LAB — GLUCOSE, CAPILLARY: Glucose-Capillary: 132 mg/dL — ABNORMAL HIGH (ref 70–99)

## 2019-06-27 SURGERY — PHACOEMULSIFICATION, CATARACT, WITH IOL INSERTION
Anesthesia: Monitor Anesthesia Care | Site: Eye | Laterality: Left

## 2019-06-27 MED ORDER — FENTANYL CITRATE (PF) 100 MCG/2ML IJ SOLN
INTRAMUSCULAR | Status: DC | PRN
  Administered 2019-06-27 (×2): 50 ug via INTRAVENOUS

## 2019-06-27 MED ORDER — TETRACAINE HCL 0.5 % OP SOLN
OPHTHALMIC | Status: AC
  Administered 2019-06-27: 1 [drp] via OPHTHALMIC
  Filled 2019-06-27: qty 4

## 2019-06-27 MED ORDER — ONDANSETRON HCL 4 MG/2ML IJ SOLN
INTRAMUSCULAR | Status: AC
  Filled 2019-06-27: qty 2

## 2019-06-27 MED ORDER — POVIDONE-IODINE 5 % OP SOLN
OPHTHALMIC | Status: DC | PRN
  Administered 2019-06-27: 1 via OPHTHALMIC

## 2019-06-27 MED ORDER — NA CHONDROIT SULF-NA HYALURON 40-17 MG/ML IO SOLN
INTRAOCULAR | Status: DC | PRN
  Administered 2019-06-27: 1 mL via INTRAOCULAR

## 2019-06-27 MED ORDER — MOXIFLOXACIN HCL 0.5 % OP SOLN
OPHTHALMIC | Status: AC
  Filled 2019-06-27: qty 3

## 2019-06-27 MED ORDER — MOXIFLOXACIN HCL 0.5 % OP SOLN
OPHTHALMIC | Status: DC | PRN
  Administered 2019-06-27: 0.2 mL via OPHTHALMIC

## 2019-06-27 MED ORDER — EPINEPHRINE PF 1 MG/ML IJ SOLN
INTRAOCULAR | Status: DC | PRN
  Administered 2019-06-27: 10:00:00 via OPHTHALMIC

## 2019-06-27 MED ORDER — MIDAZOLAM HCL 2 MG/2ML IJ SOLN
INTRAMUSCULAR | Status: AC
  Filled 2019-06-27: qty 2

## 2019-06-27 MED ORDER — LIDOCAINE HCL (PF) 4 % IJ SOLN
INTRAOCULAR | Status: DC | PRN
  Administered 2019-06-27: 4 mL via OPHTHALMIC

## 2019-06-27 MED ORDER — FENTANYL CITRATE (PF) 100 MCG/2ML IJ SOLN
INTRAMUSCULAR | Status: AC
  Filled 2019-06-27: qty 2

## 2019-06-27 MED ORDER — ONDANSETRON HCL 4 MG/2ML IJ SOLN
INTRAMUSCULAR | Status: DC | PRN
  Administered 2019-06-27: 4 mg via INTRAVENOUS

## 2019-06-27 MED ORDER — MOXIFLOXACIN HCL 0.5 % OP SOLN - NO CHARGE
1.0000 [drp] | Freq: Once | OPHTHALMIC | Status: AC
  Administered 2019-06-27: 1 [drp] via OPHTHALMIC
  Filled 2019-06-27: qty 3

## 2019-06-27 MED ORDER — TETRACAINE HCL 0.5 % OP SOLN
1.0000 [drp] | OPHTHALMIC | Status: AC | PRN
  Administered 2019-06-27 (×2): 1 [drp] via OPHTHALMIC

## 2019-06-27 MED ORDER — CARBACHOL 0.01 % IO SOLN
INTRAOCULAR | Status: DC | PRN
  Administered 2019-06-27: 0.5 mL via INTRAOCULAR

## 2019-06-27 MED ORDER — ARMC OPHTHALMIC DILATING DROPS
1.0000 "application " | OPHTHALMIC | Status: AC
  Administered 2019-06-27 (×3): 1 via OPHTHALMIC

## 2019-06-27 MED ORDER — ARMC OPHTHALMIC DILATING DROPS
OPHTHALMIC | Status: AC
  Administered 2019-06-27: 1 via OPHTHALMIC
  Filled 2019-06-27: qty 0.5

## 2019-06-27 MED ORDER — SODIUM CHLORIDE 0.9 % IV SOLN
INTRAVENOUS | Status: DC
  Administered 2019-06-27: 09:00:00 via INTRAVENOUS

## 2019-06-27 MED ORDER — MIDAZOLAM HCL 2 MG/2ML IJ SOLN
INTRAMUSCULAR | Status: DC | PRN
  Administered 2019-06-27 (×2): 1 mg via INTRAVENOUS

## 2019-06-27 SURGICAL SUPPLY — 16 items
GLOVE BIO SURGEON STRL SZ8 (GLOVE) ×3 IMPLANT
GLOVE BIOGEL M 6.5 STRL (GLOVE) ×3 IMPLANT
GLOVE SURG LX 8.0 MICRO (GLOVE) ×2
GLOVE SURG LX STRL 8.0 MICRO (GLOVE) ×1 IMPLANT
GOWN STRL REUS W/ TWL LRG LVL3 (GOWN DISPOSABLE) ×2 IMPLANT
GOWN STRL REUS W/TWL LRG LVL3 (GOWN DISPOSABLE) ×4
LABEL CATARACT MEDS ST (LABEL) ×3 IMPLANT
LENS IOL TECNIS ITEC 21.5 (Intraocular Lens) ×2 IMPLANT
PACK CATARACT (MISCELLANEOUS) ×3 IMPLANT
PACK CATARACT BRASINGTON LX (MISCELLANEOUS) ×3 IMPLANT
PACK EYE AFTER SURG (MISCELLANEOUS) ×3 IMPLANT
SOL BSS BAG (MISCELLANEOUS) ×3
SOLUTION BSS BAG (MISCELLANEOUS) ×1 IMPLANT
SYR 5ML LL (SYRINGE) ×3 IMPLANT
WATER STERILE IRR 250ML POUR (IV SOLUTION) ×3 IMPLANT
WIPE NON LINTING 3.25X3.25 (MISCELLANEOUS) ×3 IMPLANT

## 2019-06-27 NOTE — Anesthesia Preprocedure Evaluation (Signed)
Anesthesia Evaluation  Patient identified by MRN, date of birth, ID band Patient awake    Reviewed: Allergy & Precautions, NPO status , Patient's Chart, lab work & pertinent test results, reviewed documented beta blocker date and time   History of Anesthesia Complications Negative for: history of anesthetic complications  Airway Mallampati: II  TM Distance: >3 FB     Dental  (+) Chipped   Pulmonary neg pulmonary ROS,           Cardiovascular hypertension, Pt. on medications and Pt. on home beta blockers (-) angina(-) Past MI + dysrhythmias + Valvular Problems/Murmurs      Neuro/Psych    GI/Hepatic GERD  Controlled,  Endo/Other  diabetes, Type 2  Renal/GU Renal disease     Musculoskeletal  (+) Arthritis ,   Abdominal   Peds  Hematology  (+) Blood dyscrasia, anemia ,   Anesthesia Other Findings Past Medical History: No date: Anemia No date: Arthritis No date: CRD (chronic renal disease) No date: Diabetes mellitus without complication (HCC) No date: Dysrhythmia No date: GERD (gastroesophageal reflux disease) No date: Heart murmur No date: HOH (hard of hearing) No date: Hypercholesterolemia No date: Hypertension No date: Lower extremity edema No date: Neuropathy No date: Renal disorder No date: Skin cancer   Reproductive/Obstetrics                             Anesthesia Physical  Anesthesia Plan  ASA: III  Anesthesia Plan: MAC   Post-op Pain Management:    Induction: Intravenous  PONV Risk Score and Plan:   Airway Management Planned: Natural Airway and Nasal Cannula  Additional Equipment:   Intra-op Plan:   Post-operative Plan:   Informed Consent: I have reviewed the patients History and Physical, chart, labs and discussed the procedure including the risks, benefits and alternatives for the proposed anesthesia with the patient or authorized representative who has  indicated his/her understanding and acceptance.       Plan Discussed with: CRNA  Anesthesia Plan Comments:         Anesthesia Quick Evaluation

## 2019-06-27 NOTE — Anesthesia Postprocedure Evaluation (Signed)
Anesthesia Post Note  Patient: Paula Lara  Procedure(s) Performed: CATARACT EXTRACTION PHACO AND INTRAOCULAR LENS PLACEMENT ([place]) LEFT (Left Eye)  Patient location during evaluation: PACU Anesthesia Type: MAC Level of consciousness: awake and alert Pain management: pain level controlled Vital Signs Assessment: post-procedure vital signs reviewed and stable Respiratory status: spontaneous breathing, nonlabored ventilation, respiratory function stable and patient connected to nasal cannula oxygen Cardiovascular status: stable and blood pressure returned to baseline Postop Assessment: no apparent nausea or vomiting Anesthetic complications: no     Last Vitals:  Vitals:   06/27/19 0902 06/27/19 1046  BP: 134/73 118/62  Pulse: 90 88  Resp: 18 14  Temp: 36.5 C (!) 36.2 C  SpO2: 100% 98%    Last Pain:  Vitals:   06/27/19 1046  TempSrc: Temporal  PainSc: 0-No pain                 Martha Clan

## 2019-06-27 NOTE — Transfer of Care (Signed)
Immediate Anesthesia Transfer of Care Note  Patient: Paula Lara  Procedure(s) Performed: CATARACT EXTRACTION PHACO AND INTRAOCULAR LENS PLACEMENT (I[facility]) LEFT (Left Eye)  Patient Location: PACU  Anesthesia Type:MAC  Level of Consciousness: awake, alert  and oriented  Airway & Oxygen Therapy: Patient Spontanous Breathing  Post-op Assessment: Report given to RN and Post -op Vital signs reviewed and stable  Post vital signs: Reviewed and stable  Last Vitals:  Vitals Value Taken Time  BP    Temp    Pulse    Resp    SpO2      Last Pain:  Vitals:   06/27/19 0902  TempSrc: Oral  PainSc: 0-No pain         Complications: No apparent anesthesia complications

## 2019-06-27 NOTE — H&P (Signed)
All labs reviewed. Abnormal studies sent to patients PCP when indicated.  Previous H&P reviewed, patient examined, there are NO CHANGES.  Paula Lara Porfilio11/20/202010:19 AM

## 2019-06-27 NOTE — Discharge Instructions (Signed)
Eye Surgery Discharge Instructions    Expect mild scratchy sensation or mild soreness. DO NOT RUB YOUR EYE!  The day of surgery:  Minimal physical activity, but bed rest is not required  No reading, computer work, or close hand work  No bending, lifting, or straining.  May watch TV  For 24 hours:  No driving, legal decisions, or alcoholic beverages  Safety precautions  Eat anything you prefer: It is better to start with liquids, then soup then solid foods.  _____ Eye patch should be worn until postoperative exam tomorrow.  ____ Solar shield eyeglasses should be worn for comfort in the sunlight/patch while sleeping  Resume all regular medications including aspirin or Coumadin if these were discontinued prior to surgery. You may shower, bathe, shave, or wash your hair. Tylenol may be taken for mild discomfort.  Call your doctor if you experience significant pain, nausea, or vomiting, fever > 101 or other signs of infection. [phone] or [phone] Specific instructions:  Follow-up Information    Birder Robson, MD Follow up on 06/27/2019.   Specialty: Ophthalmology Why: appointment time 1:50 PM Contact information: [place] [address] [phone]         AMBULATORY SURGERY  DISCHARGE INSTRUCTIONS   1) The drugs that you were given will stay in your system until tomorrow so for the next 24 hours you should not:  A) Drive an automobile B) Make any legal decisions C) Drink any alcoholic beverage   2) You may resume regular meals tomorrow.  Today it is better to start with liquids and gradually work up to solid foods.  You may eat anything you prefer, but it is better to start with liquids, then soup and crackers, and gradually work up to solid foods.   3) Please notify your doctor immediately if you have any unusual bleeding, trouble breathing, redness and pain at the surgery site, drainage, fever, or pain not relieved by  medication.    4) Additional Instructions:        Please contact your physician with any problems or Same Day Surgery at [phone], Monday through Friday 6 am to 4 pm, or [place] at [facility] number at [phone].

## 2019-06-27 NOTE — Op Note (Signed)
PREOPERATIVE DIAGNOSIS:  Nuclear sclerotic cataract of the left eye.   POSTOPERATIVE DIAGNOSIS:  Nuclear sclerotic cataract of the left eye.   OPERATIVE PROCEDURE: Procedure(s): CATARACT EXTRACTION PHACO AND INTRAOCULAR LENS PLACEMENT (IOC) LEFT   SURGEON:  Birder Robson, MD.   ANESTHESIA:  Anesthesiologist: Martha Clan, MD CRNA: Disser, Einar Grad, CRNA  1.      Managed anesthesia care. 2.     0.52ml of Shugarcaine was instilled following the paracentesis   COMPLICATIONS:  None.   TECHNIQUE:   Stop and chop   DESCRIPTION OF PROCEDURE:  The patient was examined and consented in the preoperative holding area where the aforementioned topical anesthesia was applied to the left eye and then brought back to the Operating Room where the left eye was prepped and draped in the usual sterile ophthalmic fashion and a lid speculum was placed. A paracentesis was created with the side port blade and the anterior chamber was filled with viscoelastic. A near clear corneal incision was performed with the steel keratome. A continuous curvilinear capsulorrhexis was performed with a cystotome followed by the capsulorrhexis forceps. Hydrodissection and hydrodelineation were carried out with BSS on a blunt cannula. The lens was removed in a stop and chop  technique and the remaining cortical material was removed with the irrigation-aspiration handpiece. The capsular bag was inflated with viscoelastic and the Technis ZCB00 lens was placed in the capsular bag without complication. The remaining viscoelastic was removed from the eye with the irrigation-aspiration handpiece. The wounds were hydrated. The anterior chamber was flushed with Miostat and the eye was inflated to physiologic pressure. 0.5ml Vigamox was placed in the anterior chamber. The wounds were found to be water tight. The eye was dressed with Vigamox. The patient was given protective glasses to wear throughout the day and a shield with which to sleep  tonight. The patient was also given drops with which to begin a drop regimen today and will follow-up with me in one day. Implant Name Type Inv. Item Serial No. Manufacturer Lot No. LRB No. Used Action  LENS IOL DIOP 21.5 - [id number] Intraocular Lens LENS IOL DIOP 21.5 [id number]  Left 1 Implanted    Procedure(s) with comments: CATARACT EXTRACTION PHACO AND INTRAOCULAR LENS PLACEMENT (IOC) LEFT (Left) - [place] 00:53.3 CDE 8.90 FLUID PACK LOT # [id number]  Electronically signed: Birder Robson 06/27/2019 10:44 AM

## 2019-06-27 NOTE — Anesthesia Post-op Follow-up Note (Signed)
Anesthesia QCDR form completed.        

## 2021-07-18 ENCOUNTER — Ambulatory Visit: Payer: Medicare Other | Admitting: Urology

## 2021-07-18 ENCOUNTER — Encounter: Payer: Self-pay | Admitting: Urology

## 2021-07-18 ENCOUNTER — Other Ambulatory Visit: Payer: Self-pay

## 2021-07-18 VITALS — BP 160/103 | HR 116 | Ht 65.0 in | Wt 127.0 lb

## 2021-07-18 DIAGNOSIS — R8271 Bacteriuria: Secondary | ICD-10-CM

## 2021-07-18 DIAGNOSIS — R8281 Pyuria: Secondary | ICD-10-CM | POA: Diagnosis not present

## 2021-07-18 LAB — URINALYSIS, COMPLETE
Bilirubin, UA: NEGATIVE
Glucose, UA: NEGATIVE
Ketones, UA: NEGATIVE
Nitrite, UA: POSITIVE — AB
Specific Gravity, UA: 1.015 (ref 1.005–1.030)
Urobilinogen, Ur: 0.2 mg/dL (ref 0.2–1.0)
pH, UA: 5.5 (ref 5.0–7.5)

## 2021-07-18 LAB — MICROSCOPIC EXAMINATION: RBC, Urine: NONE SEEN /hpf (ref 0–2)

## 2021-07-18 LAB — BLADDER SCAN AMB NON-IMAGING: Scan Result: 18

## 2021-07-18 NOTE — Progress Notes (Signed)
07/18/2021 2:51 PM   Paula Lara Oct 21, 1933 [id number]  Referring provider: Murlean Iba, MD [place],  [place] [address]  Chief Complaint  Patient presents with   Other    Pyuria     HPI: Paula Lara is an 87 y.o. female referred for evaluation of pyuria.  She presents today with her daughter.  Followed by nephrology for stage 3b chronic kidney disease UAs dating back to 2019 have shown pyuria with nitrite + and urine culture is growing either Klebsiella or E. Coli She is asymptomatic and denies dysuria or flank, abdominal, pelvic pain No bothersome lower urinary tract symptoms RUS 2017 showed no urinary calculi and small left renal cysts   PMH: Past Medical History:  Diagnosis Date   Anemia    Arthritis    CRD (chronic renal disease)    Diabetes mellitus without complication (HCC)    Dysrhythmia    GERD (gastroesophageal reflux disease)    Heart murmur    HOH (hard of hearing)    Hypercholesterolemia    Hypertension    Lower extremity edema    Neuropathy    Renal disorder    Skin cancer     Surgical History: Past Surgical History:  Procedure Laterality Date   CARDIAC CATHETERIZATION     CATARACT EXTRACTION W/PHACO Right 05/30/2019   Procedure: CATARACT EXTRACTION PHACO AND INTRAOCULAR LENS PLACEMENT ([place]);  Surgeon: Birder Robson, MD;  Location: [facility] ORS;  Service: Ophthalmology;  Laterality: Right;  [place] 01:00.1 CDE 10.99 Fluid pack Lot # [id number]   CATARACT EXTRACTION W/PHACO Left 06/27/2019   Procedure: CATARACT EXTRACTION PHACO AND INTRAOCULAR LENS PLACEMENT ([place]) LEFT;  Surgeon: Birder Robson, MD;  Location: [facility] ORS;  Service: Ophthalmology;  Laterality: Left;  [place] 00:53.3 CDE 8.90 FLUID PACK LOT # [id number]   DILATION AND CURETTAGE, DIAGNOSTIC / THERAPEUTIC     EYE SURGERY     SKIN CANCER EXCISION     forehead    Home Medications:  Allergies as of 07/18/2021       Reactions   Metformin And  Related Other (See Comments)   Decreased renal function   Metoprolol Other (See Comments)   fatique   Tape         Medication List        Accurate as of July 18, 2021  2:51 PM. If you have any questions, ask your nurse or doctor.          acetaminophen 500 MG tablet Commonly known as: TYLENOL Take 1,000 mg by mouth 2 (two) times daily.   amLODipine 10 MG tablet Commonly known as: NORVASC Take 10 mg by mouth at bedtime.   aspirin 325 MG EC tablet Take 325 mg by mouth daily.   cholecalciferol 25 MCG (1000 UNIT) tablet Commonly known as: VITAMIN D Take 1,000 Units by mouth 2 (two) times daily.   diphenhydrAMINE 25 MG tablet Commonly known as: BENADRYL Take 50 mg by mouth at bedtime as needed for itching, allergies or sleep.   Docusate Sodium 100 MG capsule Take 100 mg by mouth 2 (two) times daily.   enalapril 20 MG tablet Commonly known as: VASOTEC Take 20 mg by mouth 2 (two) times daily.   glipiZIDE 10 MG tablet Commonly known as: GLUCOTROL Take 10 mg by mouth 2 (two) times daily before a meal. T   hydrochlorothiazide 12.5 MG capsule Commonly known as: MICROZIDE Take 12.5 mg by mouth daily.   Inositol Niacinate 500 MG Caps Take  1,000 mg by mouth daily.   Magnesium 500 MG Tabs Take 1 tablet by mouth daily.   metoprolol succinate 50 MG 24 hr tablet Commonly known as: TOPROL-XL Take 50 mg by mouth daily.        Allergies:  Allergies  Allergen Reactions   Metformin And Related Other (See Comments)    Decreased renal function   Metoprolol Other (See Comments)    fatique   Tape     Family History: No family history on file.  Social History:  reports that she has never smoked. She has never used smokeless tobacco. She reports that she does not drink alcohol and does not use drugs.   Physical Exam: BP (!) 160/103   Pulse (!) 116   Ht 5\' 5"  (1.651 m)   Wt 127 lb (57.6 kg)   BMI 21.13 kg/m   Constitutional:  Alert and oriented, No  acute distress. HEENT: [place] AT, moist mucus membranes.  Trachea midline, no masses. Cardiovascular: No clubbing, cyanosis, or edema. Respiratory: Normal respiratory effort, no increased work of breathing. Psychiatric: Normal mood and affect.  Laboratory Data:  Urinalysis Dipstick trace blood/nitrite +/1+ leukocytes Microscopy 6-10 WBC, many bacteria  Pertinent Imaging:  US Renal  Narrative CLINICAL DATA:  Stage III chronic renal disease  EXAM: RENAL / URINARY TRACT ULTRASOUND COMPLETE  COMPARISON:  None.  FINDINGS: Right Kidney:  Length: 9.9 cm. Echogenicity is increased. There is renal cortical thinning. No mass, perinephric fluid, or hydronephrosis visualized. No sonographically demonstrable calculus or ureterectasis.  Left Kidney:  Length: 11.0 cm. Echogenicity is increased. There is renal cortical thinning. No perinephric fluid or hydronephrosis visualized. There is a cyst in the upper pole left kidney region measuring 0.8 x 0.6 x 0.7 cm. A nearby second cyst measures 1.3 x 1.2 x 1.5 cm. No sonographically demonstrable calculus or ureterectasis.  Bladder:  Appears normal for degree of bladder distention.  IMPRESSION: Each kidney shows increased echogenicity and renal cortical thinning, findings indicative of medical renal disease. No obstructing focus identified on either side. There are small cysts in the left kidney.   Electronically Signed By: Lowella Grip III M.D. On: 06/28/2016 10:27   Assessment & Plan:    1.  Asymptomatic bacteriuria Bladder scan PVR 18 mL We discussed that asymptomatic bacteriuria is common and postmenopausal women and that no treatment or further evaluation is required Do not recommend treating when asymptomatic due to emergence of bacterial resistance Follow-up as needed    Abbie Sons, MD  [facility] [place] [address], [place], [place] [address] [phone]

## 2021-07-19 ENCOUNTER — Encounter: Payer: Self-pay | Admitting: Urology

## 2022-07-24 ENCOUNTER — Emergency Department: Payer: Medicare Other

## 2022-07-24 ENCOUNTER — Emergency Department
Admission: EM | Admit: 2022-07-24 | Discharge: 2022-08-07 | DRG: 951 | Disposition: E | Payer: Medicare Other | Attending: Internal Medicine | Admitting: Internal Medicine

## 2022-07-24 DIAGNOSIS — Z66 Do not resuscitate: Secondary | ICD-10-CM | POA: Diagnosis present

## 2022-07-24 DIAGNOSIS — K5730 Diverticulosis of large intestine without perforation or abscess without bleeding: Secondary | ICD-10-CM | POA: Diagnosis not present

## 2022-07-24 DIAGNOSIS — I129 Hypertensive chronic kidney disease with stage 1 through stage 4 chronic kidney disease, or unspecified chronic kidney disease: Secondary | ICD-10-CM | POA: Diagnosis not present

## 2022-07-24 DIAGNOSIS — I6200 Nontraumatic subdural hemorrhage, unspecified: Secondary | ICD-10-CM | POA: Diagnosis not present

## 2022-07-24 DIAGNOSIS — R8290 Unspecified abnormal findings in urine: Secondary | ICD-10-CM | POA: Insufficient documentation

## 2022-07-24 DIAGNOSIS — J90 Pleural effusion, not elsewhere classified: Secondary | ICD-10-CM | POA: Diagnosis not present

## 2022-07-24 DIAGNOSIS — N189 Chronic kidney disease, unspecified: Secondary | ICD-10-CM | POA: Diagnosis not present

## 2022-07-24 DIAGNOSIS — E1122 Type 2 diabetes mellitus with diabetic chronic kidney disease: Secondary | ICD-10-CM | POA: Diagnosis present

## 2022-07-24 DIAGNOSIS — Z85828 Personal history of other malignant neoplasm of skin: Secondary | ICD-10-CM | POA: Diagnosis not present

## 2022-07-24 DIAGNOSIS — I700 Atherosclerosis of aorta: Secondary | ICD-10-CM | POA: Insufficient documentation

## 2022-07-24 DIAGNOSIS — Z515 Encounter for palliative care: Principal | ICD-10-CM

## 2022-07-24 DIAGNOSIS — I2510 Atherosclerotic heart disease of native coronary artery without angina pectoris: Secondary | ICD-10-CM | POA: Insufficient documentation

## 2022-07-24 DIAGNOSIS — K8020 Calculus of gallbladder without cholecystitis without obstruction: Secondary | ICD-10-CM | POA: Insufficient documentation

## 2022-07-24 DIAGNOSIS — S065XAA Traumatic subdural hemorrhage with loss of consciousness status unknown, initial encounter: Principal | ICD-10-CM

## 2022-07-24 DIAGNOSIS — I469 Cardiac arrest, cause unspecified: Secondary | ICD-10-CM | POA: Diagnosis present

## 2022-07-24 DIAGNOSIS — E1140 Type 2 diabetes mellitus with diabetic neuropathy, unspecified: Secondary | ICD-10-CM | POA: Diagnosis not present

## 2022-07-24 LAB — COMPREHENSIVE METABOLIC PANEL
ALT: 10 U/L (ref 0–44)
AST: 26 U/L (ref 15–41)
Albumin: 4 g/dL (ref 3.5–5.0)
Alkaline Phosphatase: 59 U/L (ref 38–126)
Anion gap: 12 (ref 5–15)
BUN: 21 mg/dL (ref 8–23)
CO2: 28 mmol/L (ref 22–32)
Calcium: 9.2 mg/dL (ref 8.9–10.3)
Chloride: 91 mmol/L — ABNORMAL LOW (ref 98–111)
Creatinine, Ser: 0.99 mg/dL (ref 0.44–1.00)
GFR, Estimated: 56 mL/min — ABNORMAL LOW (ref >60)
Glucose, Bld: 250 mg/dL — ABNORMAL HIGH (ref 70–99)
Potassium: 3.8 mmol/L (ref 3.5–5.1)
Sodium: 131 mmol/L — ABNORMAL LOW (ref 135–145)
Total Bilirubin: 0.7 mg/dL (ref 0.3–1.2)
Total Protein: 7.1 g/dL (ref 6.5–8.1)

## 2022-07-24 LAB — PROTIME-INR
INR: 0.9 (ref 0.8–1.2)
Prothrombin Time: 12.4 seconds (ref 11.4–15.2)

## 2022-07-24 LAB — APTT: aPTT: 23 seconds — ABNORMAL LOW (ref 24–36)

## 2022-07-24 LAB — CBC WITH DIFFERENTIAL/PLATELET
Abs Immature Granulocytes: 0.09 10*3/uL — ABNORMAL HIGH (ref 0.00–0.07)
Basophils Absolute: 0.1 10*3/uL (ref 0.0–0.1)
Basophils Relative: 1 %
Eosinophils Absolute: 0 10*3/uL (ref 0.0–0.5)
Eosinophils Relative: 0 %
HCT: 36.2 % (ref 36.0–46.0)
Hemoglobin: 11.8 g/dL — ABNORMAL LOW (ref 12.0–15.0)
Immature Granulocytes: 1 %
Lymphocytes Relative: 9 %
Lymphs Abs: 1.3 10*3/uL (ref 0.7–4.0)
MCH: 29.5 pg (ref 26.0–34.0)
MCHC: 32.6 g/dL (ref 30.0–36.0)
MCV: 90.5 fL (ref 80.0–100.0)
Monocytes Absolute: 0.9 10*3/uL (ref 0.1–1.0)
Monocytes Relative: 6 %
Neutro Abs: 12.2 10*3/uL — ABNORMAL HIGH (ref 1.7–7.7)
Neutrophils Relative %: 83 %
Platelets: 317 10*3/uL (ref 150–400)
RBC: 4 MIL/uL (ref 3.87–5.11)
RDW: 12 % (ref 11.5–15.5)
WBC: 14.6 10*3/uL — ABNORMAL HIGH (ref 4.0–10.5)
nRBC: 0 % (ref 0.0–0.2)

## 2022-07-24 LAB — URINALYSIS, COMPLETE (UACMP) WITH MICROSCOPIC
Bilirubin Urine: NEGATIVE
Glucose, UA: >=500 mg/dL — AB
Hgb urine dipstick: NEGATIVE
Ketones, ur: 5 mg/dL — AB
Leukocytes,Ua: NEGATIVE
Nitrite: NEGATIVE
Protein, ur: >=300 mg/dL — AB
Specific Gravity, Urine: 1.007 (ref 1.005–1.030)
pH: 7 (ref 5.0–8.0)

## 2022-07-24 LAB — TROPONIN I (HIGH SENSITIVITY): Troponin I (High Sensitivity): 9 ng/L (ref <18)

## 2022-07-24 LAB — LACTIC ACID, PLASMA: Lactic Acid, Venous: 3.7 mmol/L (ref 0.5–1.9)

## 2022-07-24 MED ORDER — LORAZEPAM 2 MG/ML IJ SOLN
1.0000 mg | Freq: Once | INTRAMUSCULAR | Status: AC
  Administered 2022-07-24: 1 mg via INTRAVENOUS
  Filled 2022-07-24: qty 1

## 2022-07-24 MED ORDER — FENTANYL CITRATE PF 50 MCG/ML IJ SOSY: 50.0000 ug | PREFILLED_SYRINGE | INTRAMUSCULAR | Status: DC | PRN

## 2022-07-24 MED ORDER — FENTANYL CITRATE PF 50 MCG/ML IJ SOSY
50.0000 ug | PREFILLED_SYRINGE | Freq: Once | INTRAMUSCULAR | Status: AC
  Administered 2022-07-24: 50 ug via INTRAVENOUS
  Filled 2022-07-24: qty 1

## 2022-07-24 MED ORDER — LORAZEPAM 2 MG/ML IJ SOLN: 2.0000 mg | INTRAMUSCULAR | Status: DC | PRN

## 2022-07-24 MED ORDER — NAPHAZOLINE-GLYCERIN 0.012-0.25 % OP SOLN: 1.0000 [drp] | Freq: Four times a day (QID) | OPHTHALMIC | Status: DC | PRN

## 2022-07-24 MED ORDER — FENTANYL CITRATE PF 50 MCG/ML IJ SOSY
50.0000 ug | PREFILLED_SYRINGE | Freq: Once | INTRAMUSCULAR | Status: DC
  Filled 2022-07-24: qty 1

## 2022-07-24 MED ORDER — SODIUM CHLORIDE 0.9 % IV BOLUS
1000.0000 mL | Freq: Once | INTRAVENOUS | Status: AC
  Administered 2022-07-24: 1000 mL via INTRAVENOUS

## 2022-07-24 MED ORDER — SCOPOLAMINE 1 MG/3DAYS TD PT72
1.0000 | MEDICATED_PATCH | TRANSDERMAL | Status: DC
  Filled 2022-07-24: qty 1

## 2022-07-24 MED ORDER — SODIUM CHLORIDE 0.9 % IV SOLN
2.0000 g | Freq: Once | INTRAVENOUS | Status: DC
  Filled 2022-07-24: qty 20

## 2022-07-27 LAB — URINE CULTURE: Culture: >=100000 — AB

## 2022-07-29 LAB — CULTURE, BLOOD (ROUTINE X 2)
Culture: NO GROWTH
Culture: NO GROWTH
Special Requests: ADEQUATE
Special Requests: ADEQUATE

## 2022-08-07 NOTE — ED Provider Notes (Signed)
[facility] Provider Note   Event Date/Time   First MD Initiated Contact with Patient 2022/08/06 1138     (approximate)  History   Altered Mental Status  HPI  Paula Lara is a 89 y.o. female Review of a recent surgical history and physical from 1023 has a history of anemia, diabetes, chronic kidney disease, neuropathy, hypertension cardiac arrhythmia and skin cancer as well as previous heart catheterization   I discussed with the patient's daughter, Mrs. Paula Lara.  She advised she found her mother this morning unresponsive at her home.  She noted that the trash can was tipped over and is not clear if she could have fallen or passed out.  Patient has healthcare power of attorney paperwork that designates Gildardo Pounds as her healthcare agent.  I discussed her condition with Mrs. Paula Lara who is the daughter, and she advises that her mother would not want to have any heroic measures, be placed on a ventilator, receive CPR or otherwise.  We are okay with diagnostic testing IV fluids but the primary goal of her care if she is in critical condition is felt to be comfort.  I discussed with the daughter, and based on her presentation she is clearly critically ill, but patient's goals of care do not include ICU level care such as heroic measures, CPR, intubation.  Daughter is understanding and agreeable with continuation of the patient's planned DNR.  Daughter asked that we not place her on a ventilator.  We did discuss that given the patient's condition she is at high risk for aspiration, vomiting, potentially worsening of her condition, but based on her goals of care she is to remain DNR  Physical Exam   Triage Vital Signs: ED Triage Vitals [06-Aug-2022 1133]  Enc Vitals Group     BP (!) 151/78     Pulse Rate 94     Resp 18     Temp (!) 96.3 F (35.7 C)     Temp Source Rectal     SpO2 100 %     Weight      Height      Head Circumference      Peak Flow      Pain Score       Pain Loc      Pain Edu?      Excl. in [place]?     Most recent vital signs: Vitals:   August 06, 2022 1133  BP: (!) 151/78  Pulse: 94  Resp: 18  Temp: (!) 96.3 F (35.7 C)  SpO2: 100%     General: Completely unresponsive, seems to be demonstrating some element of almost D corticated like posturing.  She does not respond to any tactile or painful stimuli.  Does not localize pain. CV:  Good peripheral perfusion.  Dron carotid and normal heart tones. Resp:  Normal effort.  Abnormal respiratory pattern, with occasional mixed bradypnea versus tachypnea at times.  No wheezing.  Faint crackles noted in the right lower base.  Patient's jaw is clenched, and she does have some sonorous respirations.  Nasal pharyngeal airway in place Abd:  No distention.  Soft nontender nondistended Other:  Extremities no obvious trauma except questionably some swelling of the wrist bilaterally.  There is no open wounds or fractures noted that are obvious.  No bleeding.  No lacerations.   ED Results / Procedures / Treatments   Labs (all labs ordered are listed, but only abnormal results are displayed) Labs Reviewed  LACTIC ACID, PLASMA -  Abnormal; Notable for the following components:      Result Value   Lactic Acid, Venous 3.7 (*)    All other components within normal limits  COMPREHENSIVE METABOLIC PANEL - Abnormal; Notable for the following components:   Sodium 131 (*)    Chloride 91 (*)    Glucose, Bld 250 (*)    GFR, Estimated 56 (*)    All other components within normal limits  CBC WITH DIFFERENTIAL/PLATELET - Abnormal; Notable for the following components:   WBC 14.6 (*)    Hemoglobin 11.8 (*)    Neutro Abs 12.2 (*)    Abs Immature Granulocytes 0.09 (*)    All other components within normal limits  APTT - Abnormal; Notable for the following components:   aPTT 23 (*)    All other components within normal limits  URINALYSIS, COMPLETE (UACMP) WITH MICROSCOPIC - Abnormal; Notable for the following  components:   Color, Urine STRAW (*)    APPearance CLEAR (*)    Glucose, UA >=500 (*)    Ketones, ur 5 (*)    Protein, ur >=300 (*)    Bacteria, UA RARE (*)    All other components within normal limits  CULTURE, BLOOD (ROUTINE X 2)  CULTURE, BLOOD (ROUTINE X 2)  URINE CULTURE  PROTIME-INR  TROPONIN I (HIGH SENSITIVITY)     EKG  Interpreted by me at 1145 heart rate 95 QRS 90 QTc 480 Notable baseline artifact, appears to be normal sinus rhythm, no evidence of obvious acute ischemia or ectopy.  EKG also interpreted at 1140 heart rate 80 QRS 170 QTc approximately 500, notable artifact, suspect underlying normal sinus rhythm without obvious evidence of frank ischemia   RADIOLOGY  Discussed with radiology team, patient will be prioritized for stat CT head and cervical spine   CT ABDOMEN PELVIS WO CONTRAST  Result Date: 08-15-22 CLINICAL DATA:  Abdominal pain EXAM: CT ABDOMEN AND PELVIS WITHOUT CONTRAST TECHNIQUE: Multidetector CT imaging of the abdomen and pelvis was performed following the standard protocol without IV contrast. RADIATION DOSE REDUCTION: This exam was performed according to the departmental dose-optimization program which includes automated exposure control, adjustment of the mA and/or kV according to patient size and/or use of iterative reconstruction technique. COMPARISON:  None Available. FINDINGS: Lower chest: Cardiomegaly. Coronary artery calcifications. Trace bilateral pleural effusions and associated atelectasis or consolidation. Hepatobiliary: No solid liver abnormality is seen. Gallstones. No gallbladder wall thickening, or biliary dilatation. Pancreas: Unremarkable. No pancreatic ductal dilatation or surrounding inflammatory changes. Spleen: Normal in size without significant abnormality. Adrenals/Urinary Tract: Adrenal glands are unremarkable. Multiple bilateral hyperdense renal lesions, incompletely characterized although statistically most likely benign  hemorrhagic or proteinaceous cysts. Additional simple renal cortical cysts, benign, for which no specific further characterization or follow-up is required. Foley catheter in the urinary bladder. Stomach/Bowel: Stomach is within normal limits. Appendix appears normal. No evidence of bowel wall thickening, distention, or inflammatory changes. Descending and sigmoid diverticulosis. Vascular/Lymphatic: Aortic atherosclerosis. No enlarged abdominal or pelvic lymph nodes. Reproductive: Calcified uterine fibroids. Other: No abdominal wall hernia or abnormality. No ascites. Musculoskeletal: No acute or significant osseous findings. IMPRESSION: 1. No acute noncontrast CT findings of the abdomen or pelvis to explain abdominal pain. 2. Descending and sigmoid diverticulosis without evidence of acute diverticulitis. 3. Cholelithiasis without evidence of acute cholecystitis. 4. Trace bilateral pleural effusions and associated atelectasis or consolidation. 5. Cardiomegaly and coronary artery disease. 6. Multiple bilateral hyperdense renal lesions, incompletely characterized although statistically most likely benign hemorrhagic or proteinaceous cysts.  Consider multiphasic contrast enhanced CT or MRI to further characterize and exclude solid renal lesion on a nonemergent, outpatient basis if clinically appropriate. Aortic Atherosclerosis (ICD10-I70.0). Electronically Signed   By: Delanna Ahmadi M.D.   On: Aug 10, 2022 12:48   CT Head Wo Contrast  Result Date: 08-10-2022 CLINICAL DATA:  Head trauma, intracranial venous injury suspected; Neck trauma (Age >= 65y) EXAM: CT HEAD WITHOUT CONTRAST CT CERVICAL SPINE WITHOUT CONTRAST TECHNIQUE: Multidetector CT imaging of the head and cervical spine was performed following the standard protocol without intravenous contrast. Multiplanar CT image reconstructions of the cervical spine were also generated. RADIATION DOSE REDUCTION: This exam was performed according to the departmental  dose-optimization program which includes automated exposure control, adjustment of the mA and/or kV according to patient size and/or use of iterative reconstruction technique. COMPARISON:  None Available. FINDINGS: CT HEAD FINDINGS Brain: Large acute/hyperacute left cerebral convexity subdural hematoma measuring up to 2.7 cm with approximately 1.9 cm of rightward midline shift. Hemorrhage tracks along the falx and inferior to the left frontal lobe. Effacement of the left lateral ventricle. Mass effect on the upper brain stem. Mild rounding of the right temporal horn could represent early ventricular trapping. Vascular: No hyperdense vessel identified. Skull: No acute fracture. Sinuses/Orbits: Clear sinuses.  No acute orbital findings in Other: No mastoid effusions. CT CERVICAL SPINE FINDINGS Alignment: No substantial sagittal subluxation. Skull base and vertebrae: Osteopenia. Vertebral body heights are maintained. No evidence of acute fracture. Soft tissues and spinal canal: No prevertebral fluid or swelling. No visible canal hematoma. Disc levels:  Multilevel degenerative change. Upper chest: Visualized lung apices are clear. IMPRESSION: 1. Large acute/hyperacute left cerebral convexity subdural hematoma measuring up to 2.7 cm with approximately 1.9 cm of rightward midline shift. Mass effect on the upper brain stem. 2. No evidence of acute fracture or traumatic malalignment in the cervical spine. Electronically Signed   By: Margaretha Sheffield M.D.   On: 2022/08/10 12:44   CT Cervical Spine Wo Contrast  Result Date: 10-Aug-2022 CLINICAL DATA:  Head trauma, intracranial venous injury suspected; Neck trauma (Age >= 65y) EXAM: CT HEAD WITHOUT CONTRAST CT CERVICAL SPINE WITHOUT CONTRAST TECHNIQUE: Multidetector CT imaging of the head and cervical spine was performed following the standard protocol without intravenous contrast. Multiplanar CT image reconstructions of the cervical spine were also generated. RADIATION  DOSE REDUCTION: This exam was performed according to the departmental dose-optimization program which includes automated exposure control, adjustment of the mA and/or kV according to patient size and/or use of iterative reconstruction technique. COMPARISON:  None Available. FINDINGS: CT HEAD FINDINGS Brain: Large acute/hyperacute left cerebral convexity subdural hematoma measuring up to 2.7 cm with approximately 1.9 cm of rightward midline shift. Hemorrhage tracks along the falx and inferior to the left frontal lobe. Effacement of the left lateral ventricle. Mass effect on the upper brain stem. Mild rounding of the right temporal horn could represent early ventricular trapping. Vascular: No hyperdense vessel identified. Skull: No acute fracture. Sinuses/Orbits: Clear sinuses.  No acute orbital findings in Other: No mastoid effusions. CT CERVICAL SPINE FINDINGS Alignment: No substantial sagittal subluxation. Skull base and vertebrae: Osteopenia. Vertebral body heights are maintained. No evidence of acute fracture. Soft tissues and spinal canal: No prevertebral fluid or swelling. No visible canal hematoma. Disc levels:  Multilevel degenerative change. Upper chest: Visualized lung apices are clear. IMPRESSION: 1. Large acute/hyperacute left cerebral convexity subdural hematoma measuring up to 2.7 cm with approximately 1.9 cm of rightward midline shift. Mass effect on the upper  brain stem. 2. No evidence of acute fracture or traumatic malalignment in the cervical spine. Electronically Signed   By: Margaretha Sheffield M.D.   On: 2022/08/21 12:44   DG Chest Port 1 View  Result Date: 2022/08/21 CLINICAL DATA:  Questionable sepsis, altered mental status EXAM: PORTABLE CHEST 1 VIEW COMPARISON:  09/10/2016 FINDINGS: The heart size and mediastinal contours are within normal limits. Both lungs are clear. The visualized skeletal structures are unremarkable. IMPRESSION: No acute abnormality of the lungs in AP portable  projection. Electronically Signed   By: Delanna Ahmadi M.D.   On: 2022/08/21 12:39      PROCEDURES:  Critical Care performed: Yes, see critical care procedure note(s) CRITICAL CARE Performed by: Delman Kitten   Total critical care time: 40 minutes  Critical care time was exclusive of separately billable procedures and treating other patients.  Critical care was necessary to treat or prevent imminent or life-threatening deterioration.  Critical care was time spent personally by me on the following activities: development of treatment plan with patient and/or surrogate as well as nursing, discussions with consultants, evaluation of patient's response to treatment, examination of patient, obtaining history from patient or surrogate, ordering and performing treatments and interventions, ordering and review of laboratory studies, ordering and review of radiographic studies, pulse oximetry and re-evaluation of patient's condition.  Procedures   MEDICATIONS ORDERED IN ED: Medications  LORazepam (ATIVAN) injection 2 mg (has no administration in time range)  fentaNYL (SUBLIMAZE) injection 50 mcg (has no administration in time range)  fentaNYL (SUBLIMAZE) injection 50 mcg (has no administration in time range)  scopolamine (TRANSDERM-SCOP) 1 MG/3DAYS 1.5 mg (has no administration in time range)  naphazoline-glycerin (CLEAR EYES REDNESS) ophth solution 1-2 drop (has no administration in time range)  fentaNYL (SUBLIMAZE) injection 50 mcg (50 mcg Intravenous Given 08/21/2022 1205)  LORazepam (ATIVAN) injection 1 mg (1 mg Intravenous Given 08/21/2022 1206)  sodium chloride 0.9 % bolus 1,000 mL (1,000 mLs Intravenous New Bag/Given 08-21-22 1154)     IMPRESSION / MDM / ASSESSMENT AND PLAN / ED COURSE  I reviewed the triage vital signs and the nursing notes.                              Differential diagnosis includes, but is not limited to, catastrophic neurologic causation, major toxicologic,  metabolic, or other acute abnormality.  Cardiac causes pulmonary causes, and a broad range of etiologies are considered.  The patient's presentation and unresponsiveness and posturing are highly concerning for possible acute neurologic this point I do wish to rule out intracranial hemorrhage stroke or obvious gross neurologic lesion.  Patient will go for CT scan.  Additional workup is pending, no clear indicators of obvious infectious cause based on the clinical history provided, but will send sepsis panel and continue to evaluate closely  Goals of care are very important, the patient's healthcare power of attorney who is her daughter has indicated the patient is to have comfort measures primarily and no heroic interventions.  We will withhold intubation, DNR, or what would be felt heroic measures at this time.  Patient's presentation is most consistent with acute presentation with potential threat to life or bodily function.    The patient is on the cardiac monitor to evaluate for evidence of arrhythmia and/or significant heart rate changes.   Clinical Course as of 2022/08/21 1346  Mon 08/21/2022 Personally reviewed the patient's CT scan of the  head and she appears to have a large acute gross intracranial hemorrhage.  I paged Dr. Cari Caraway of neurosurgery for stat consultation [MQ]    Clinical Course User Index [MQ] Delman Kitten, MD   ----------------------------------------- 1:01 PM on August 21, 2022 ----------------------------------------- Case and clinical history discussed with Dr. Cari Caraway.  He advised this appears to be a major hemorrhage, and suspects there is evidence of brainstem stroke already.  Discussed with the patient's family including daughter and her husband who are at the bedside, and we will now officially moved towards end-of-life care and comfort measures only.  I have paged our hospitalist, discussed case with Dr. Blaine Hamper Who will be admitting, and have also placed a  consult to palliative medicine   Cancel admission as patient has developed bradypnea and agonal rhythm.  Appears death is imminent.  Family at bedside aware and attentive  ----------------------------------------- 1:43 PM on August 21, 2022 -----------------------------------------   Time of death 1:37 PM.  The patient was witnessed to be agonal on ECG monitor, and then completely asystolic and at approximately 1:37 PM.  Patient has no audible heart tones on 30s auscultation, no evidence of respiratory support or breathing, no heart tones, absent corneal reflexes, is pale and is dead.  Family including patient's daughter, son-in-law, and now grandson present at bedside understanding.  I have contacted the medical examiner Lear Ng). Will accept as an ME case.   FINAL CLINICAL IMPRESSION(S) / ED DIAGNOSES   Final diagnoses:  Subdural hematoma ([place])  Asystolic cardiac arrest.   Rx / DC Orders   ED Discharge Orders     None        Note:  This document was prepared using Dragon voice recognition software and may include unintentional dictation errors.   Delman Kitten, MD 21-Aug-2022 [phone]

## 2022-08-07 NOTE — Progress Notes (Signed)
   August 19, 2022 1300  Clinical Encounter Type  Visited With Patient and family together  Visit Type Initial;Patient actively dying  Referral From Nurse  Consult/Referral To Chaplain  Spiritual Encounters  Spiritual Needs Grief support;Prayer   Chaplain responded to call to walk with family through EOL with patient. Chaplain provided support through prayer, story listening and reflecting, and assistance in logistical matters.

## 2022-08-07 NOTE — ED Notes (Signed)
Called Organ Donation- pt is not eligible for donation. Referral # [id number], spoke with Baldemar Friday.

## 2022-08-07 NOTE — ED Triage Notes (Signed)
Per ems, pt was found unresponsive in her RV today. LKW by family is 1930 yesterday. Pt arrives on 15 L O2. No known trauma per ems, but they state deformity to left wrist. Found in vomit and urine at scene by EMS.

## 2022-08-07 NOTE — Consult Note (Signed)
Patient discussed with Dr. Jacqualine Code.  Patient presented in extremis.    CT shows 28 mm L acute subdural hematoma causing 30m of midline shift.  The CT shows hypoattenuation of the pons suggesting that her pons has already suffered a stroke.  This is a nonrecoverable injury.  Surgery is not indicated.  Dr. QJacqualine Codewill discuss with family, as the patient has made her wishes known to her family.

## 2022-08-07 NOTE — ED Notes (Signed)
Pt placed in body bag. Tag on toe, extra tag inside bag, paper work in bag. Bag secured with last tag on bag

## 2022-08-07 DEATH — deceased
